# Patient Record
Sex: Male | Born: 1990 | Race: White | Hispanic: No | Marital: Single | Smoking: Never smoker
Health system: Southern US, Community
[De-identification: ages and names within clinical notes are randomized; demographics above are authoritative.]

## PROBLEM LIST (undated history)

## (undated) DIAGNOSIS — F959 Tic disorder, unspecified: Secondary | ICD-10-CM

## (undated) DIAGNOSIS — F419 Anxiety disorder, unspecified: Secondary | ICD-10-CM

## (undated) DIAGNOSIS — F909 Attention-deficit hyperactivity disorder, unspecified type: Secondary | ICD-10-CM

## (undated) HISTORY — DX: Anxiety disorder, unspecified: F41.9

## (undated) HISTORY — PX: TONSILECTOMY, ADENOIDECTOMY, BILATERAL MYRINGOTOMY AND TUBES: SHX2538

## (undated) HISTORY — DX: Tic disorder, unspecified: F95.9

## (undated) HISTORY — DX: Attention-deficit hyperactivity disorder, unspecified type: F90.9

## (undated) HISTORY — PX: TYMPANOSTOMY TUBE PLACEMENT: SHX32

---

## 2016-03-15 ENCOUNTER — Ambulatory Visit (INDEPENDENT_AMBULATORY_CARE_PROVIDER_SITE_OTHER): Payer: 59 | Admitting: Nurse Practitioner

## 2016-03-15 ENCOUNTER — Telehealth: Payer: Self-pay

## 2016-03-15 ENCOUNTER — Other Ambulatory Visit: Payer: Self-pay

## 2016-03-15 ENCOUNTER — Encounter: Payer: Self-pay | Admitting: Nurse Practitioner

## 2016-03-15 DIAGNOSIS — R935 Abnormal findings on diagnostic imaging of other abdominal regions, including retroperitoneum: Secondary | ICD-10-CM

## 2016-03-15 DIAGNOSIS — R1031 Right lower quadrant pain: Secondary | ICD-10-CM | POA: Diagnosis not present

## 2016-03-15 DIAGNOSIS — R109 Unspecified abdominal pain: Secondary | ICD-10-CM | POA: Insufficient documentation

## 2016-03-15 DIAGNOSIS — G8929 Other chronic pain: Secondary | ICD-10-CM

## 2016-03-15 MED ORDER — NA SULFATE-K SULFATE-MG SULF 17.5-3.13-1.6 GM/177ML PO SOLN: 1.0000 | ORAL | 0 refills | Status: DC

## 2016-03-15 NOTE — Telephone Encounter (Signed)
Pre-op appt 03/25/16 at 2:45pm. Called and informed pt's mother. Letter mailed with prep instructions.

## 2016-03-15 NOTE — Assessment & Plan Note (Signed)
Abdominal pain is worse in the right lower quadrant. It is still ongoing. He is due to finish his antibiotics and prednisone today. CT concerning for possible Crohn's disease as noted above. Further workup as per above, proceed with colonoscopy.

## 2016-03-15 NOTE — Telephone Encounter (Signed)
Called pt's mother. TCS with Propofol with [facility] scheduled for 03/30/16 at 12:15pm. Will mail instructions when pre-op is made.

## 2016-03-15 NOTE — Patient Instructions (Signed)
PA# for TCS: [id number]

## 2016-03-15 NOTE — Progress Notes (Signed)
cc'ed to pcp °

## 2016-03-15 NOTE — Progress Notes (Signed)
Primary Care Physician:  Donzetta SprungERRY DANIEL, MD Primary Gastroenterologist:  Dr. Darrick PennaFields  Chief Complaint  Patient presents with  . Abdominal Pain    severe over 1 wk, RLQ  . Constipation  . Diarrhea  . Nausea    comes and goes    HPI:   Jason Meza is a 25 y.o. male who presents On referral from primary care for possible Crohn's disease. Patient was last seen by primary care on 03/10/2016. PCP notes reviewed. Patient noted right lower quadrant pain, severe/acute. Worsening tenderness to palpation, no guarding or acute abdomen, no hepatosplenomegaly. CT of the abdomen and pelvis was ordered and completed at [facility]. Findings include distal ileal and proximal cecal wall thickening with mucosal irregularity in the region of the ileocecal valve, no fistula, no frank bowel obstruction. Appearance concerning for either Crohn's disease or terminal ileitis/cecal colitis. No bowel abnormality appreciated elsewhere. Normal appendix. The patient was started on Cipro and Flagyl as well as a course of prednisone. He is completed prednisone, is on his last day of antibiotics.  Today he is accompanied by his mother. Today he states his abdominal pain is still present. Saturday was better, then became worse again. Still on prednisone and antibiotics. Started as bilateral side/back pain. He went a week without a bowel movement but took a laxative and 4 days ago has a diarrhea bowel movement. Was having generalized abdominal pain and when he had a bowel movement it improved but the RLQ pain remained, described as "throbbing." Denies hematochezia, melena. Has been nauseated but no vomiting. Has had poor energy for about the past 4-5 months. Mom notes he has been really sluiggish, came home from work, eat, and then did nothing; friends asking him to go out and would say "Mom I just don't feel like doing anything." Thinks he has an on/off fever last week (wake in cold sweats, get really cold then really  hot.)   26 week premie, has had persistent health problems. Family history of lots of "bowel issues."   Past Medical History:  Diagnosis Date  . Anxiety   . Tic disorder     Past Surgical History:  Procedure Laterality Date  . TONSILECTOMY, ADENOIDECTOMY, BILATERAL MYRINGOTOMY AND TUBES    . TYMPANOSTOMY TUBE PLACEMENT      Current Outpatient Prescriptions  Medication Sig Dispense Refill  . buPROPion (WELLBUTRIN XL) 150 MG 24 hr tablet Take 150 mg by mouth daily.    . busPIRone (BUSPAR) 15 MG tablet Take 15 mg by mouth 3 (three) times daily.  0  . ciprofloxacin (CIPRO) 500 MG tablet Take 500 mg by mouth 2 (two) times daily. Will finish 03/15/16    . DULoxetine (CYMBALTA) 60 MG capsule Take 60 mg by mouth 2 (two) times daily.    . metroNIDAZOLE (FLAGYL) 500 MG tablet Take 500 mg by mouth 2 (two) times daily. Will finish 03/15/16    . traZODone (DESYREL) 50 MG tablet Take 50 mg by mouth at bedtime.     No current facility-administered medications for this visit.     Allergies as of 03/15/2016  . (No Known Allergies)    Family History  Problem Relation Age of Onset  . Colon cancer Paternal Grandfather   . Crohn's disease Neg Hx   . Ulcerative colitis Neg Hx     Social History   Social History  . Marital status: Single    Spouse name: N/A  . Number of children: N/A  . Years of education:  N/A   Occupational History  . Not on file.   Social History Main Topics  . Smoking status: Never Smoker  . Smokeless tobacco: Current User    Types: Snuff  . Alcohol use Yes     Comment: 2 weekends a month  . Drug use: No  . Sexual activity: Not on file   Other Topics Concern  . Not on file   Social History Narrative  . No narrative on file    Review of Systems: Complete ROS negative except as per HPI.    Physical Exam: BP 123/78   Pulse 89   Temp 97.8 F (36.6 C) (Oral)   Ht 5\' 8"  (1.727 m)   Wt 159 lb 12.8 oz (72.5 kg)   BMI 24.30 kg/m  General:   Alert  and oriented. Pleasant and cooperative. Well-nourished and well-developed. Tic noted. Head:  Normocephalic and atraumatic. Eyes:  Without icterus, sclera clear and conjunctiva pink.  Ears:  Normal auditory acuity. Cardiovascular:  S1, S2 present without murmurs appreciated. Extremities without clubbing or edema. Respiratory:  Clear to auscultation bilaterally. No wheezes, rales, or rhonchi. No distress.  Gastrointestinal:  +BS, soft, and non-distended. Significant pain lower abdomen and RLQ; no RLQ hernia noted. No HSM noted. No guarding or rebound. No masses appreciated.  Rectal:  Deferred  Musculoskalatal:  Symmetrical without gross deformities. Neurologic:  Alert and oriented x4;  grossly normal neurologically. Psych:  Alert and cooperative. Normal mood and affect. Heme/Lymph/Immune: No excessive bruising noted.    03/15/2016 10:04 AM   Disclaimer: This note was dictated with voice recognition software. Similar sounding words can inadvertently be transcribed and may not be corrected upon review.

## 2016-03-15 NOTE — Assessment & Plan Note (Addendum)
CT of the abdomen completed at [facility] notes ileocecal valve and cecal inflammation, concerning for Crohn's. No other abnormalities. I will check a CBC, CMP, CRP, ESR today. We'll set the patient up for colonoscopy to further evaluate. Return for follow-up in 4-6 weeks.  Proceed with colonoscopy on propofol/MAC with Dr. Oneida Alar in the near future. The risks, benefits, and alternatives have been discussed in detail with the patient. They state understanding and desire to proceed.   The patient is currently on Wellbutrin, BuSpar, Cymbalta, and trazodone. No other anticoagulants, anxiolytics, chronic pain medications, or antidepressants. We will plan for the procedure on propofol/MAC to promote adequate sedation.

## 2016-03-15 NOTE — Patient Instructions (Signed)
1. Have your blood work drawn when you're able to. 2. We will schedule your procedure for you as soon as possible. 3. Return for follow-up in 6 weeks. 4. If you have any severe or worsening symptoms call us. If we are unavailable, or if you have unbearable abdominal pain, difficulty keeping down food and fluids, rectal bleeding, or other "warning signs" proceed to the emergency room.

## 2016-03-16 LAB — COMPREHENSIVE METABOLIC PANEL
ALT: 22 U/L (ref 9–46)
AST: 15 U/L (ref 10–40)
Albumin: 4.8 g/dL (ref 3.6–5.1)
Alkaline Phosphatase: 41 U/L (ref 40–115)
BUN: 12 mg/dL (ref 7–25)
CHLORIDE: 100 mmol/L (ref 98–110)
CO2: 26 mmol/L (ref 20–31)
Calcium: 9.9 mg/dL (ref 8.6–10.3)
Creat: 1.23 mg/dL (ref 0.60–1.35)
GLUCOSE: 85 mg/dL (ref 65–99)
POTASSIUM: 4.1 mmol/L (ref 3.5–5.3)
Sodium: 138 mmol/L (ref 135–146)
Total Bilirubin: 0.7 mg/dL (ref 0.2–1.2)
Total Protein: 6.9 g/dL (ref 6.1–8.1)

## 2016-03-16 LAB — CBC WITH DIFFERENTIAL/PLATELET
BASOS PCT: 0 %
Basophils Absolute: 0 cells/uL (ref 0–200)
EOS ABS: 67 {cells}/uL (ref 15–500)
EOS PCT: 1 %
HCT: 49.9 % (ref 38.5–50.0)
Hemoglobin: 17.3 g/dL — ABNORMAL HIGH (ref 13.2–17.1)
LYMPHS PCT: 23 %
Lymphs Abs: 1541 cells/uL (ref 850–3900)
MCH: 32.9 pg (ref 27.0–33.0)
MCHC: 34.7 g/dL (ref 32.0–36.0)
MCV: 94.9 fL (ref 80.0–100.0)
MONOS PCT: 6 %
MPV: 10.5 fL (ref 7.5–12.5)
Monocytes Absolute: 402 cells/uL (ref 200–950)
Neutro Abs: 4690 cells/uL (ref 1500–7800)
Neutrophils Relative %: 70 %
PLATELETS: 290 10*3/uL (ref 140–400)
RBC: 5.26 MIL/uL (ref 4.20–5.80)
RDW: 13.6 % (ref 11.0–15.0)
WBC: 6.7 10*3/uL (ref 3.8–10.8)

## 2016-03-16 LAB — C-REACTIVE PROTEIN: CRP: <0.2 mg/L (ref <8.0)

## 2016-03-17 LAB — SEDIMENTATION RATE: Sed Rate: 1 mm/hr (ref 0–15)

## 2016-03-17 NOTE — Progress Notes (Signed)
PT is aware.

## 2016-03-17 NOTE — Progress Notes (Signed)
LMOM to call.

## 2016-03-20 NOTE — Progress Notes (Signed)
REVIEWED. TCS WITH ULTRASLIM COLONOSCOPE.

## 2016-03-23 NOTE — Patient Instructions (Signed)
Jason Meza  03/23/2016     @PREFPERIOPPHARMACY @   Your procedure is scheduled on  03/30/2016   [facility] to [facility] at  1015  A.M.  Call this number if you have problems the morning of surgery:  [phone]   Remember:  Do not eat food or drink liquids after midnight.  Take these medicines the morning of surgery with A SIP OF WATER  Wellbutrin, buspar, cymbalta.   Do not wear jewelry, make-up or nail polish.  Do not wear lotions, powders, or perfumes, or deoderant.  Do not shave 48 hours prior to surgery.  Men may shave face and neck.  Do not bring valuables to the hospital.  [facility] is not responsible for any belongings or valuables.  Contacts, dentures or bridgework may not be worn into surgery.  Leave your suitcase in the car.  After surgery it may be brought to your room.  For patients admitted to the hospital, discharge time will be determined by your treatment team.  Patients discharged the day of surgery will not be allowed to drive home.   Name and phone number of your driver:   family Special instructions:  Follow the diet and prep instructions given to you by Dr Evelina DunField's office.  Please read over the following fact sheets that you were given. Anesthesia Post-op Instructions and Care and Recovery After Surgery       Colonoscopy, Adult A colonoscopy is an exam to look at the large intestine. It is done to check for problems, such as:  Lumps (tumors).  Growths (polyps).  Swelling (inflammation).  Bleeding. What happens before the procedure? Eating and drinking Follow instructions from your doctor about eating and drinking. These instructions may include:  A few days before the procedure - follow a low-fiber diet.  Avoid nuts.  Avoid seeds.  Avoid dried fruit.  Avoid raw fruits.  Avoid vegetables.  1-3 days before the procedure - follow a clear liquid diet. Avoid liquids that have red or purple dye. Drink only clear  liquids, such as:  Clear broth or bouillon.  Black coffee or tea.  Clear juice.  Clear soft drinks or sports drinks.  [place] desert.  Popsicles.  On the day of the procedure - do not eat or drink anything during the 2 hours before the procedure. Bowel prep If you were prescribed an oral bowel prep:  Take it as told by your doctor. Starting the day before your procedure, you will need to drink a lot of liquid. The liquid will cause you to poop (have bowel movements) until your poop is almost clear or light green.  If your skin or butt gets irritated from diarrhea, you may:  Wipe the area with wipes that have medicine in them, such as adult wet wipes with aloe and vitamin E.  Put something on your skin that soothes the area, such as petroleum jelly.  If you throw up (vomit) while drinking the bowel prep, take a break for up to 60 minutes. Then begin the bowel prep again. If you keep throwing up and you cannot take the bowel prep without throwing up, call your doctor. General instructions  Ask your doctor about changing or stopping your normal medicines. This is important if you take diabetes medicines or blood thinners.  Plan to have someone take you home from the hospital or clinic. What happens during the procedure?  An IV tube may be put into one  of your veins.  You will be given medicine to help you relax (sedative).  To reduce your risk of infection:  Your doctors will wash their hands.  Your anal area will be washed with soap.  You will be asked to lie on your side with your knees bent.  Your doctor will get a long, thin, flexible tube ready. The tube will have a camera and a light on the end.  The tube will be put into your anus.  The tube will be gently put into your large intestine.  Air will be delivered into your large intestine to keep it open. You may feel some pressure or cramping.  The camera will be used to take photos.  A small tissue sample may  be removed from your body to be looked at under a microscope (biopsy). If any possible problems are found, the tissue will be sent to a lab for testing.  If small growths are found, your doctor may remove them and have them checked for cancer.  The tube that was put into your anus will be slowly removed. The procedure may vary among doctors and hospitals. What happens after the procedure?  Your doctor will check on you often until the medicines you were given have worn off.  Do not drive for 24 hours after the procedure.  You may have a small amount of blood in your poop.  You may pass gas.  You may have mild cramps or bloating in your belly (abdomen).  It is up to you to get the results of your procedure. Ask your doctor, or the department performing the procedure, when your results will be ready. This information is not intended to replace advice given to you by your health care provider. Make sure you discuss any questions you have with your health care provider. Document Released: 04/17/2010 Document Revised: 11/20/2015 Document Reviewed: 05/27/2015 Elsevier Interactive Patient Education  2017 [employer]  Colonoscopy, Adult, Care After This sheet gives you information about how to care for yourself after your procedure. Your doctor may also give you more specific instructions. If you have problems or questions, call your doctor. Follow these instructions at home: General instructions  For the first 24 hours after the procedure:  Do not drive or use machinery.  Do not sign important documents.  Do not drink alcohol.  Do your daily activities more slowly than normal.  Eat foods that are soft and easy to digest.  Rest often.  Take over-the-counter or prescription medicines only as told by your doctor.  It is up to you to get the results of your procedure. Ask your doctor, or the department performing the procedure, when your results will be ready. To help cramping  and bloating:  Try walking around.  Put heat on your belly (abdomen) as told by your doctor. Use a heat source that your doctor recommends, such as a moist heat pack or a heating pad.  Put a towel between your skin and the heat source.  Leave the heat on for 20-30 minutes.  Remove the heat if your skin turns bright red. This is especially important if you cannot feel pain, heat, or cold. You can get burned. Eating and drinking  Drink enough fluid to keep your pee (urine) clear or pale yellow.  Return to your normal diet as told by your doctor. Avoid heavy or fried foods that are hard to digest.  Avoid drinking alcohol for as long as told by your doctor. Contact  a doctor if:  You have blood in your poop (stool) 2-3 days after the procedure. Get help right away if:  You have more than a small amount of blood in your poop.  You see large clumps of tissue (blood clots) in your poop.  Your belly is swollen.  You feel sick to your stomach (nauseous).  You throw up (vomit).  You have a fever.  You have belly pain that gets worse, and medicine does not help your pain. This information is not intended to replace advice given to you by your health care provider. Make sure you discuss any questions you have with your health care provider. Document Released: 04/17/2010 Document Revised: 12/08/2015 Document Reviewed: 12/08/2015 Elsevier Interactive Patient Education  2017 [employer] Inc[employer]  Monitored Anesthesia Care Anesthesia is a term that refers to techniques, procedures, and medicines that help a person stay safe and comfortable during a medical procedure. Monitored anesthesia care, or sedation, is one type of anesthesia. Your anesthesia specialist may recommend sedation if you will be having a procedure that does not require you to be unconscious, such as:  Cataract surgery.  A dental procedure.  A biopsy.  A colonoscopy. During the procedure, you may receive a medicine to help  you relax (sedative). There are three levels of sedation:  Mild sedation. At this level, you may feel awake and relaxed. You will be able to follow directions.  Moderate sedation. At this level, you will be sleepy. You may not remember the procedure.  Deep sedation. At this level, you will be asleep. You will not remember the procedure. The more medicine you are given, the deeper your level of sedation will be. Depending on how you respond to the procedure, the anesthesia specialist may change your level of sedation or the type of anesthesia to fit your needs. An anesthesia specialist will monitor you closely during the procedure. Let your health care provider know about:  Any allergies you have.  All medicines you are taking, including vitamins, herbs, eye drops, creams, and over-the-counter medicines.  Any use of steroids (by mouth or as a cream).  Any problems you or family members have had with sedatives and anesthetic medicines.  Any blood disorders you have.  Any surgeries you have had.  Any medical conditions you have, such as sleep apnea.  Whether you are pregnant or may be pregnant.  Any use of cigarettes, alcohol, or street drugs. What are the risks? Generally, this is a safe procedure. However, problems may occur, including:  Getting too much medicine (oversedation).  Nausea.  Allergic reaction to medicines.  Trouble breathing. If this happens, a breathing tube may be used to help with breathing. It will be removed when you are awake and breathing on your own.  Heart trouble.  Lung trouble. Before the procedure Staying hydrated  Follow instructions from your health care provider about hydration, which may include:  Up to 2 hours before the procedure - you may continue to drink clear liquids, such as water, clear fruit juice, black coffee, and plain tea. Eating and drinking restrictions  Follow instructions from your health care provider about eating and  drinking, which may include:  8 hours before the procedure - stop eating heavy meals or foods such as meat, fried foods, or fatty foods.  6 hours before the procedure - stop eating light meals or foods, such as toast or cereal.  6 hours before the procedure - stop drinking milk or drinks that contain milk.  2  hours before the procedure - stop drinking clear liquids. Medicines  Ask your health care provider about:  Changing or stopping your regular medicines. This is especially important if you are taking diabetes medicines or blood thinners.  Taking medicines such as aspirin and ibuprofen. These medicines can thin your blood. Do not take these medicines before your procedure if your health care provider instructs you not to. Tests and exams  You will have a physical exam.  You may have blood tests done to show:  How well your kidneys and liver are working.  How well your blood can clot.  General instructions  Plan to have someone take you home from the hospital or clinic.  If you will be going home right after the procedure, plan to have someone with you for 24 hours. What happens during the procedure?  Your blood pressure, heart rate, breathing, level of pain and overall condition will be monitored.  An IV tube will be inserted into one of your veins.  Your anesthesia specialist will give you medicines as needed to keep you comfortable during the procedure. This may mean changing the level of sedation.  The procedure will be performed. After the procedure  Your blood pressure, heart rate, breathing rate, and blood oxygen level will be monitored until the medicines you were given have worn off.  Do not drive for 24 hours if you received a sedative.  You may:  Feel sleepy, clumsy, or nauseous.  Feel forgetful about what happened after the procedure.  Have a sore throat if you had a breathing tube during the procedure.  Vomit. This information is not intended to  replace advice given to you by your health care provider. Make sure you discuss any questions you have with your health care provider. Document Released: 12/09/2004 Document Revised: 08/22/2015 Document Reviewed: 07/06/2015 Elsevier Interactive Patient Education  2017 [employer] Inc[employer] Monitored Anesthesia Care, Care After These instructions provide you with information about caring for yourself after your procedure. Your health care provider may also give you more specific instructions. Your treatment has been planned according to current medical practices, but problems sometimes occur. Call your health care provider if you have any problems or questions after your procedure. What can I expect after the procedure? After your procedure, it is common to:  Feel sleepy for several hours.  Feel clumsy and have poor balance for several hours.  Feel forgetful about what happened after the procedure.  Have poor judgment for several hours.  Feel nauseous or vomit.  Have a sore throat if you had a breathing tube during the procedure. Follow these instructions at home: For at least 24 hours after the procedure:   Do not:  Participate in activities in which you could fall or become injured.  Drive.  Use heavy machinery.  Drink alcohol.  Take sleeping pills or medicines that cause drowsiness.  Make important decisions or sign legal documents.  Take care of children on your own.  Rest. Eating and drinking  Follow the diet that is recommended by your health care provider.  If you vomit, drink water, juice, or soup when you can drink without vomiting.  Make sure you have little or no nausea before eating solid foods. General instructions  Have a responsible adult stay with you until you are awake and alert.  Take over-the-counter and prescription medicines only as told by your health care provider.  If you smoke, do not smoke without supervision.  Keep all follow-up visits as told  by your health care provider. This is important. Contact a health care provider if:  You keep feeling nauseous or you keep vomiting.  You feel light-headed.  You develop a rash.  You have a fever. Get help right away if:  You have trouble breathing. This information is not intended to replace advice given to you by your health care provider. Make sure you discuss any questions you have with your health care provider. Document Released: 07/06/2015 Document Revised: 11/05/2015 Document Reviewed: 07/06/2015 Elsevier Interactive Patient Education  2017 [employer].

## 2016-03-25 ENCOUNTER — Encounter (HOSPITAL_COMMUNITY)
Admission: RE | Admit: 2016-03-25 | Discharge: 2016-03-25 | Disposition: A | Discharge status: Home / Self Care | Payer: 59 | Source: Ambulatory Visit | Attending: Gastroenterology | Admitting: Gastroenterology

## 2016-03-25 ENCOUNTER — Encounter: Payer: Self-pay | Admitting: Gastroenterology

## 2016-03-25 ENCOUNTER — Encounter (HOSPITAL_COMMUNITY): Payer: Self-pay

## 2016-03-30 ENCOUNTER — Encounter (HOSPITAL_COMMUNITY): Payer: Self-pay

## 2016-03-30 ENCOUNTER — Ambulatory Visit (HOSPITAL_COMMUNITY): Payer: 59 | Admitting: Anesthesiology

## 2016-03-30 ENCOUNTER — Encounter (HOSPITAL_COMMUNITY)
Admission: RE | Disposition: A | Discharge status: Home / Self Care | Payer: Self-pay | Source: Ambulatory Visit | Attending: Gastroenterology

## 2016-03-30 ENCOUNTER — Ambulatory Visit (HOSPITAL_COMMUNITY)
Admission: RE | Admit: 2016-03-30 | Discharge: 2016-03-30 | Disposition: A | Discharge status: Home / Self Care | Payer: 59 | Source: Ambulatory Visit | Attending: Gastroenterology | Admitting: Gastroenterology

## 2016-03-30 DIAGNOSIS — F419 Anxiety disorder, unspecified: Secondary | ICD-10-CM | POA: Insufficient documentation

## 2016-03-30 DIAGNOSIS — G8929 Other chronic pain: Secondary | ICD-10-CM

## 2016-03-30 DIAGNOSIS — Q438 Other specified congenital malformations of intestine: Secondary | ICD-10-CM | POA: Insufficient documentation

## 2016-03-30 DIAGNOSIS — Z800 Family history of malignant neoplasm of digestive organs: Secondary | ICD-10-CM | POA: Diagnosis not present

## 2016-03-30 DIAGNOSIS — R109 Unspecified abdominal pain: Secondary | ICD-10-CM | POA: Diagnosis not present

## 2016-03-30 DIAGNOSIS — Z9889 Other specified postprocedural states: Secondary | ICD-10-CM | POA: Diagnosis not present

## 2016-03-30 DIAGNOSIS — R935 Abnormal findings on diagnostic imaging of other abdominal regions, including retroperitoneum: Secondary | ICD-10-CM

## 2016-03-30 DIAGNOSIS — F959 Tic disorder, unspecified: Secondary | ICD-10-CM | POA: Diagnosis not present

## 2016-03-30 DIAGNOSIS — K529 Noninfective gastroenteritis and colitis, unspecified: Secondary | ICD-10-CM | POA: Diagnosis not present

## 2016-03-30 DIAGNOSIS — R933 Abnormal findings on diagnostic imaging of other parts of digestive tract: Secondary | ICD-10-CM | POA: Diagnosis not present

## 2016-03-30 DIAGNOSIS — Z79899 Other long term (current) drug therapy: Secondary | ICD-10-CM | POA: Diagnosis not present

## 2016-03-30 DIAGNOSIS — R1031 Right lower quadrant pain: Secondary | ICD-10-CM

## 2016-03-30 DIAGNOSIS — K6389 Other specified diseases of intestine: Secondary | ICD-10-CM | POA: Insufficient documentation

## 2016-03-30 HISTORY — PX: BIOPSY: SHX5522

## 2016-03-30 HISTORY — PX: COLONOSCOPY WITH PROPOFOL: SHX5780

## 2016-03-30 SURGERY — COLONOSCOPY WITH PROPOFOL
Anesthesia: Monitor Anesthesia Care

## 2016-03-30 MED ORDER — MIDAZOLAM HCL 2 MG/2ML IJ SOLN
INTRAMUSCULAR | Status: AC
  Filled 2016-03-30: qty 2

## 2016-03-30 MED ORDER — PROPOFOL 10 MG/ML IV BOLUS
INTRAVENOUS | Status: AC
  Filled 2016-03-30: qty 20

## 2016-03-30 MED ORDER — MIDAZOLAM HCL 5 MG/5ML IJ SOLN
INTRAMUSCULAR | Status: DC | PRN
  Administered 2016-03-30 (×2): 2 mg via INTRAVENOUS

## 2016-03-30 MED ORDER — CHLORHEXIDINE GLUCONATE CLOTH 2 % EX PADS: 6.0000 | MEDICATED_PAD | Freq: Once | CUTANEOUS | Status: DC

## 2016-03-30 MED ORDER — FENTANYL CITRATE (PF) 100 MCG/2ML IJ SOLN
25.0000 ug | INTRAMUSCULAR | Status: AC | PRN
  Administered 2016-03-30 (×2): 25 ug via INTRAVENOUS

## 2016-03-30 MED ORDER — PROPOFOL 500 MG/50ML IV EMUL
INTRAVENOUS | Status: DC | PRN
  Administered 2016-03-30: 125 ug/kg/min via INTRAVENOUS

## 2016-03-30 MED ORDER — LACTATED RINGERS IV SOLN
INTRAVENOUS | Status: DC
  Administered 2016-03-30: 1000 mL via INTRAVENOUS

## 2016-03-30 MED ORDER — FENTANYL CITRATE (PF) 100 MCG/2ML IJ SOLN
INTRAMUSCULAR | Status: AC
  Filled 2016-03-30: qty 2

## 2016-03-30 MED ORDER — MIDAZOLAM HCL 2 MG/2ML IJ SOLN
1.0000 mg | INTRAMUSCULAR | Status: DC | PRN
  Administered 2016-03-30: 2 mg via INTRAVENOUS

## 2016-03-30 NOTE — Discharge Instructions (Signed)
YOU DID NOT HAVE ANY POLYPS. Your small bowel was mildly inflamed but YOUR COLON IS NORMAL.   DRINK WATER TO KEEP YOUR URINE LIGHT YELLOW.  FOLLOW A HIGH FIBER DIET.  AVOID ITEMS THAT CAUSE BLOATING & GAS. SEE INFO BELOW.  I WILL SCHEDULE YOUR FOLLOW UP VISIT AFTER THE FINAL PATHOLOGY REPORT COMES BACK TO ME.   Colonoscopy Care After Read the instructions outlined below and refer to this sheet in the next week. These discharge instructions provide you with general information on caring for yourself after you leave the hospital. While your treatment has been planned according to the most current medical practices available, unavoidable complications occasionally occur. If you have any problems or questions after discharge, call DR. Breigh Annett, [phone].  ACTIVITY  You may resume your regular activity, but move at a slower pace for the next 24 hours.   Take frequent rest periods for the next 24 hours.   Walking will help get rid of the air and reduce the bloated feeling in your belly (abdomen).   No driving for 24 hours (because of the medicine (anesthesia) used during the test).   You may shower.   Do not sign any important legal documents or operate any machinery for 24 hours (because of the anesthesia used during the test).    NUTRITION  Drink plenty of fluids.   You may resume your normal diet as instructed by your doctor.   Begin with a light meal and progress to your normal diet. Heavy or fried foods are harder to digest and may make you feel sick to your stomach (nauseated).   Avoid alcoholic beverages for 24 hours or as instructed.    MEDICATIONS  You may resume your normal medications.   WHAT YOU CAN EXPECT TODAY  Some feelings of bloating in the abdomen.   Passage of more gas than usual.   Spotting of blood in your stool or on the toilet paper  .  IF YOU HAD POLYPS REMOVED DURING THE COLONOSCOPY:  Eat a soft diet IF YOU HAVE NAUSEA, BLOATING, ABDOMINAL  PAIN, OR VOMITING.    FINDING OUT THE RESULTS OF YOUR TEST Not all test results are available during your visit. DR. Darrick PennaFIELDS WILL CALL YOU WITHIN 14 DAYS OF YOUR PROCEDUE WITH YOUR RESULTS. Do not assume everything is normal if you have not heard from DR. Saphyra Hutt, CALL HER OFFICE AT [phone].  SEEK IMMEDIATE MEDICAL ATTENTION AND CALL THE OFFICE: [phone] IF:  You have more than a spotting of blood in your stool.   Your belly is swollen (abdominal distention).   You are nauseated or vomiting.   You have a temperature over 101F.   You have abdominal pain or discomfort that is severe or gets worse throughout the day.   High-Fiber Diet A high-fiber diet changes your normal diet to include more whole grains, legumes, fruits, and vegetables. Changes in the diet involve replacing refined carbohydrates with unrefined foods. The calorie level of the diet is essentially unchanged. The Dietary Reference Intake (recommended amount) for adult males is 38 grams per day. For adult females, it is 25 grams per day. Pregnant and lactating women should consume 28 grams of fiber per day. Fiber is the intact part of a plant that is not broken down during digestion. Functional fiber is fiber that has been isolated from the plant to provide a beneficial effect in the body.  PURPOSE  Increase stool bulk.   Ease and regulate bowel movements.   Lower  cholesterol.   REDUCE RISK OF COLON CANCER  INDICATIONS THAT YOU NEED MORE FIBER  Constipation and hemorrhoids.   Uncomplicated diverticulosis (intestine condition) and irritable bowel syndrome.   Weight management.   As a protective measure against hardening of the arteries (atherosclerosis), diabetes, and cancer.   GUIDELINES FOR INCREASING FIBER IN THE DIET  Start adding fiber to the diet slowly. A gradual increase of about 5 more grams (2 slices of whole-wheat bread, 2 servings of most fruits or vegetables, or 1 bowl of high-fiber cereal)  per day is best. Too rapid an increase in fiber may result in constipation, flatulence, and bloating.   Drink enough water and fluids to keep your urine clear or pale yellow. Water, juice, or caffeine-free drinks are recommended. Not drinking enough fluid may cause constipation.   Eat a variety of high-fiber foods rather than one type of fiber.   Try to increase your intake of fiber through using high-fiber foods rather than fiber pills or supplements that contain small amounts of fiber.   The goal is to change the types of food eaten. Do not supplement your present diet with high-fiber foods, but replace foods in your present diet.   INCLUDE A VARIETY OF FIBER SOURCES  Replace refined and processed grains with whole grains, canned fruits with fresh fruits, and incorporate other fiber sources. White rice, white breads, and most bakery goods contain little or no fiber.   Brown whole-grain rice, buckwheat oats, and many fruits and vegetables are all good sources of fiber. These include: broccoli, Brussels sprouts, cabbage, cauliflower, beets, sweet potatoes, white potatoes (skin on), carrots, tomatoes, eggplant, squash, berries, fresh fruits, and dried fruits.   Cereals appear to be the richest source of fiber. Cereal fiber is found in whole grains and bran. Bran is the fiber-rich outer coat of cereal grain, which is largely removed in refining. In whole-grain cereals, the bran remains. In breakfast cereals, the largest amount of fiber is found in those with "bran" in their names. The fiber content is sometimes indicated on the label.      You may need to include additional fruits and vegetables each day.   In baking, for 1 cup white flour, you may use the following substitutions:   1 cup whole-wheat flour minus 2 tablespoons.   1/2 cup white flour plus 1/2 cup whole-wheat flour

## 2016-03-30 NOTE — H&P (Signed)
  Primary Care Physician:  Gar Ponto, MD Primary Gastroenterologist:  Dr. Oneida Alar  Pre-Procedure History & Physical: HPI:  Jason Meza is a 26 y.o. male here for Abnormal CT scan-THICKENED CECAL WALL/ILEUM.  Past Medical History:  Diagnosis Date  . Anxiety   . Tic disorder     Past Surgical History:  Procedure Laterality Date  . TONSILECTOMY, ADENOIDECTOMY, BILATERAL MYRINGOTOMY AND TUBES    . TYMPANOSTOMY TUBE PLACEMENT      Prior to Admission medications   Medication Sig Start Date End Date Taking? Authorizing Provider  buPROPion (WELLBUTRIN XL) 150 MG 24 hr tablet Take 150 mg by mouth daily.   Yes Historical Provider, MD  busPIRone (BUSPAR) 15 MG tablet Take 15 mg by mouth 3 (three) times daily. 01/05/16  Yes Historical Provider, MD  DULoxetine (CYMBALTA) 60 MG capsule Take 60 mg by mouth 2 (two) times daily.   Yes Historical Provider, MD  Na Sulfate-K Sulfate-Mg Sulf (SUPREP BOWEL PREP KIT) 17.5-3.13-1.6 GM/180ML SOLN Take 1 kit by mouth as directed. 03/15/16  Yes Danie Binder, MD  traZODone (DESYREL) 50 MG tablet Take 50 mg by mouth at bedtime.   Yes Historical Provider, MD    Allergies as of 03/15/2016  . (No Known Allergies)    Family History  Problem Relation Age of Onset  . Colon cancer Paternal Grandfather   . Crohn's disease Neg Hx   . Ulcerative colitis Neg Hx     Social History   Social History  . Marital status: Single    Spouse name: N/A  . Number of children: N/A  . Years of education: N/A   Occupational History  . Not on file.   Social History Main Topics  . Smoking status: Never Smoker  . Smokeless tobacco: Current User    Types: Snuff  . Alcohol use Yes     Comment: 2 weekends a month  . Drug use: No  . Sexual activity: Not on file   Other Topics Concern  . Not on file   Social History Narrative  . No narrative on file    Review of Systems: See HPI, otherwise negative ROS   Physical Exam: BP 114/76   Pulse 81   Temp 97.8  F (36.6 C) (Oral)   Resp (!) 26   Ht _0  (1.727 m)   Wt 159 lb (72.1 kg)   SpO2 97%   BMI 24.18 kg/m  General:   Alert,  pleasant and cooperative in NAD Head:  Normocephalic and atraumatic. Neck:  Supple; Lungs:  Clear throughout to auscultation.    Heart:  Regular rate and rhythm. Abdomen:  Soft, nontender and nondistended. Normal bowel sounds, without guarding, and without rebound.   Neurologic:  Alert and  oriented x4;  grossly normal neurologically.  Impression/Plan:     Abnormal CT scan-THICKENED CECAL WALL/ILEUM  Plan:  TCS TODAY. DISCUSSED PROCEDURE, BENEFITS, & RISKS: < 1% chance of medication reaction, bleeding, perforation, or rupture of spleen/liver.

## 2016-03-30 NOTE — Anesthesia Preprocedure Evaluation (Signed)
Anesthesia Evaluation  Patient identified by MRN, date of birth, ID band Patient awake    Reviewed: Allergy & Precautions, NPO status , Patient's Chart, lab work & pertinent test results  Airway Mallampati: II  TM Distance: >3 FB     Dental  (+) Teeth Intact   Pulmonary neg pulmonary ROS,    breath sounds clear to auscultation       Cardiovascular negative cardio ROS   Rhythm:Regular Rate:Normal     Neuro/Psych PSYCHIATRIC DISORDERS Anxiety    GI/Hepatic neg GERD  ,  Endo/Other    Renal/GU      Musculoskeletal   Abdominal   Peds  Hematology   Anesthesia Other Findings   Reproductive/Obstetrics                             Anesthesia Physical Anesthesia Plan  ASA: II  Anesthesia Plan: MAC   Post-op Pain Management:    Induction: Intravenous  Airway Management Planned: Simple Face Mask  Additional Equipment:   Intra-op Plan:   Post-operative Plan:   Informed Consent: I have reviewed the patients History and Physical, chart, labs and discussed the procedure including the risks, benefits and alternatives for the proposed anesthesia with the patient or authorized representative who has indicated his/her understanding and acceptance.     Plan Discussed with:   Anesthesia Plan Comments:         Anesthesia Quick Evaluation

## 2016-03-30 NOTE — Op Note (Signed)
[facility] Patient Name: Jason Meza Procedure Date: 03/30/2016 10:37 AM MRN: [id number] Date of Birth: [birth year] Attending MD: Jonette Eva , MD CSN: [id number] Age: 26 Admit Type: Outpatient Procedure:                Colonoscopy With COLD FORCEPS BIOPSY Indications:              Abnormal CT of the GI tract-FAMILY HISTORY OF COLON                            CANCER Providers:                Jonette Eva, MD, Nena Polio, RN, Lollie Marrow. Lake,                            Pensions consultant Referring MD:             Lucita Lora. Daniel Medicines:                Propofol per Anesthesia Complications:            No immediate complications. Estimated Blood Loss:     Estimated blood loss was minimal. Procedure:                Pre-Anesthesia Assessment:                           - Prior to the procedure, a History and Physical                            was performed, and patient medications and                            allergies were reviewed. The patient's tolerance of                            previous anesthesia was also reviewed. The risks                            and benefits of the procedure and the sedation                            options and risks were discussed with the patient.                            All questions were answered, and informed consent                            was obtained. Prior Anticoagulants: The patient has                            taken no previous anticoagulant or antiplatelet                            agents. ASA Grade Assessment: II - A patient with  mild systemic disease. After reviewing the risks                            and benefits, the patient was deemed in                            satisfactory condition to undergo the procedure.                            After obtaining informed consent, the colonoscope                            was passed under direct vision. Throughout the                            procedure,  the patient's blood pressure, pulse, and                            oxygen saturations were monitored continuously. The                            EC-[id number] ([id number]) scope was introduced through                            the anus and advanced to the 10 cm into the ileum.                            The terminal ileum, ileocecal valve, appendiceal                            orifice, and rectum were photographed. The                            colonoscopy was performed without difficulty. The                            patient tolerated the procedure well. The quality                            of the bowel preparation was excellent. Scope In: 11:03:51 AM Scope Out: 11:24:14 AM Scope Withdrawal Time: 0 hours 16 minutes 14 seconds  Total Procedure Duration: 0 hours 20 minutes 23 seconds  Findings:      Patchy inflammation, mild in severity and characterized by congestion       (edema) and erythema was found in the terminal ileum. This was biopsied       with a cold forceps for histology(#1).      The rectum, recto-sigmoid colon and sigmoid colon were mildly redundant.       Multiple biopsies were obtained in the rectum(#4), in the sigmoid       colon/in the descending colon,(#3) at the splenic flexure, in the       transverse colon/in the ascending colon/in the cecum(#2) with cold       forceps for histology.      No additional abnormalities were found  on retroflexion. Impression:               - MILD Ileitis.                           - Redundant LEFT colon. Moderate Sedation:      Per Anesthesia Care Recommendation:           - Resume previous diet.                           - Continue present medications.                           - Await pathology results.                           - Return to my office at appointment to be                            scheduled AFTER PATH REPORT FINAL.                           - Patient has a contact number available for                             emergencies. The signs and symptoms of potential                            delayed complications were discussed with the                            patient. Return to normal activities tomorrow.                            Written discharge instructions were provided to the                            patient.                           - No repeat colonoscopy due to age. Procedure Code(s):        --- Professional ---                           [phone], Colonoscopy, flexible; with biopsy, single                            or multiple Diagnosis Code(s):        --- Professional ---                           K52.9, Noninfective gastroenteritis and colitis,                            unspecified  R93.3, Abnormal findings on diagnostic imaging of                            other parts of digestive tract                           Q43.8, Other specified congenital malformations of                            intestine CPT copyright 2016 [employer]. All rights reserved. The codes documented in this report are preliminary and upon coder review may  be revised to meet current compliance requirements. Jonette Eva, MD Jonette Eva, MD 03/30/2016 11:49:16 AM This report has been signed electronically. Number of Addenda: 0

## 2016-03-30 NOTE — Anesthesia Postprocedure Evaluation (Signed)
Anesthesia Post Note  Patient: Jason Meza  Procedure(s) Performed: Procedure(s) (LRB): COLONOSCOPY WITH PROPOFOL (N/A) BIOPSY  Patient location during evaluation: PACU Anesthesia Type: MAC Level of consciousness: patient cooperative and awake Pain management: pain level controlled Vital Signs Assessment: post-procedure vital signs reviewed and stable Respiratory status: spontaneous breathing, nonlabored ventilation and respiratory function stable Cardiovascular status: blood pressure returned to baseline Postop Assessment: no signs of nausea or vomiting Anesthetic complications: no     Last Vitals:  Vitals:   03/30/16 1040 03/30/16 1136  BP: 114/74   Pulse:  86  Resp: 15 13  Temp:  36.4 C    Last Pain:  Vitals:   03/30/16 1136  TempSrc:   PainSc: Asleep                 Leahann Lempke J

## 2016-03-30 NOTE — Transfer of Care (Signed)
Immediate Anesthesia Transfer of Care Note  Patient: Jason Meza  Procedure(s) Performed: Procedure(s) with comments: COLONOSCOPY WITH PROPOFOL (N/A) - 12:15pm BIOPSY - ileum, right and leeft colon; rectum  Patient Location: PACU  Anesthesia Type:MAC  Level of Consciousness: patient cooperative  Airway & Oxygen Therapy: Patient Spontanous Breathing and Patient connected to nasal cannula oxygen  Post-op Assessment: Report given to RN and Post -op Vital signs reviewed and stable  Post vital signs: Reviewed and stable  Last Vitals:  Vitals:   03/30/16 1035 03/30/16 1040  BP: 106/68 114/74  Pulse:    Resp: (!) 0 15  Temp:      Last Pain:  Vitals:   03/30/16 0852  TempSrc: Oral      Patients Stated Pain Goal: 4 (16/57/90 3833)  Complications: No apparent anesthesia complications

## 2016-03-31 ENCOUNTER — Telehealth: Payer: Self-pay | Admitting: Gastroenterology

## 2016-03-31 NOTE — Telephone Encounter (Signed)
PLEASE CALL PT. HIS BIOPSIES SHOW MILD INFLAMMATION IN THE SMALL BOWEL. HE HAS NORMAL COLON AND RECTUM BIOPSIES.   DRINK WATER. EAT FIBER. PLEASE CALL WITH QUESTIONS OR CONCERNS.

## 2016-04-01 ENCOUNTER — Encounter (HOSPITAL_COMMUNITY): Payer: Self-pay | Admitting: Gastroenterology

## 2016-04-01 NOTE — Telephone Encounter (Signed)
Pt is aware of results. 

## 2016-04-19 ENCOUNTER — Ambulatory Visit: Payer: 59 | Admitting: Nurse Practitioner

## 2016-05-05 ENCOUNTER — Ambulatory Visit: Payer: 59 | Admitting: Nurse Practitioner

## 2016-05-17 DIAGNOSIS — Z0001 Encounter for general adult medical examination with abnormal findings: Secondary | ICD-10-CM | POA: Diagnosis not present

## 2016-05-17 DIAGNOSIS — Z6823 Body mass index (BMI) 23.0-23.9, adult: Secondary | ICD-10-CM | POA: Diagnosis not present

## 2016-05-17 DIAGNOSIS — Z23 Encounter for immunization: Secondary | ICD-10-CM | POA: Diagnosis not present

## 2016-06-01 DIAGNOSIS — J019 Acute sinusitis, unspecified: Secondary | ICD-10-CM | POA: Diagnosis not present

## 2016-06-02 ENCOUNTER — Ambulatory Visit: Payer: 59 | Admitting: Nurse Practitioner

## 2016-06-02 NOTE — Progress Notes (Deleted)
Referring Provider: Caryl Bis, MD Primary Care Physician:  Gar Ponto, MD Primary GI:  Dr. Oneida Alar  No chief complaint on file.   HPI:   Jason Meza is a 26 y.o. male who presents for follow-up on abdominal pain and abnormal CT the abdomen. The patient was last seen in our office 03/15/2016. He has a history of significant "bowel issues" and was born at 91 weeks. CT of the abdomen and pelvis was ordered and completed at [facility]. Findings include distal ileal and proximal cecal wall thickening with mucosal irregularity in the region of the ileocecal valve, no fistula, no frank bowel obstruction. Appearance concerning for either Crohn's disease or terminal ileitis/cecal colitis. No bowel abnormality appreciated elsewhere. Normal appendix.  At the time of his last visit he was still having abdominal pain which is waxing and waning in intensity. Improvement with bowel movement in general, right lower quadrant pain remained described as "throbbing." Noted poor energy for the previous 4-6 months. Labs are ordered including CBC, CMP, CRP, ESR. Additionally, the patient was arranged for colonoscopy on propofol/MAC due to polypharmacy. CBC was essentially normal, CMP normal, CRP and ESR normal.  Colonoscopy completed 03/30/2016 which found patchy inflammation, mild in severity and characterized by edema and erythema in the terminal ileum status post biopsy. Noted redundancy of the rectum, rectosigmoid colon, sigmoid colon with multiple biopsies obtained in the rectum, sigmoid colon/descending colon, splenic flexure, transverse colon, ascending colon. Overall impression mild ileitis. Surgical pathology as per below. Overall biopsy showed mild inflammation in the small bowel, normal colon and rectum. Recommended drink water, eat fiber, call with questions or concerns.  Today he states     Past Medical History:  Diagnosis Date  . Anxiety   . Tic disorder     Past Surgical  History:  Procedure Laterality Date  . BIOPSY  03/30/2016   Procedure: BIOPSY;  Surgeon: Danie Binder, MD;  Location: [facility];  Service: Endoscopy;;  ileum, right and leeft colon; rectum  . COLONOSCOPY WITH PROPOFOL N/A 03/30/2016   Procedure: COLONOSCOPY WITH PROPOFOL;  Surgeon: Danie Binder, MD;  Location: [facility];  Service: Endoscopy;  Laterality: N/A;  12:15pm  . TONSILECTOMY, ADENOIDECTOMY, BILATERAL MYRINGOTOMY AND TUBES    . TYMPANOSTOMY TUBE PLACEMENT      Current Outpatient Prescriptions  Medication Sig Dispense Refill  . buPROPion (WELLBUTRIN XL) 150 MG 24 hr tablet Take 150 mg by mouth daily.    . busPIRone (BUSPAR) 15 MG tablet Take 15 mg by mouth 3 (three) times daily.  0  . DULoxetine (CYMBALTA) 60 MG capsule Take 60 mg by mouth 2 (two) times daily.    . traZODone (DESYREL) 50 MG tablet Take 50 mg by mouth at bedtime.     No current facility-administered medications for this visit.     Allergies as of 06/02/2016  . (No Known Allergies)    Family History  Problem Relation Age of Onset  . Colon cancer Paternal Grandfather   . Crohn's disease Neg Hx   . Ulcerative colitis Neg Hx     Social History   Social History  . Marital status: Single    Spouse name: N/A  . Number of children: N/A  . Years of education: N/A   Social History Main Topics  . Smoking status: Never Smoker  . Smokeless tobacco: Current User    Types: Snuff  . Alcohol use Yes     Comment: 2 weekends a month  .  Drug use: No  . Sexual activity: Not on file   Other Topics Concern  . Not on file   Social History Narrative  . No narrative on file    Review of Systems: General: Negative for anorexia, weight loss, fever, chills, fatigue, weakness. Eyes: Negative for vision changes.  ENT: Negative for hoarseness, difficulty swallowing , nasal congestion. CV: Negative for chest pain, angina, palpitations, dyspnea on exertion, peripheral edema.  Respiratory: Negative for dyspnea  at rest, dyspnea on exertion, cough, sputum, wheezing.  GI: See history of present illness. GU:  Negative for dysuria, hematuria, urinary incontinence, urinary frequency, nocturnal urination.  MS: Negative for joint pain, low back pain.  Derm: Negative for rash or itching.  Neuro: Negative for weakness, abnormal sensation, seizure, frequent headaches, memory loss, confusion.  Psych: Negative for anxiety, depression, suicidal ideation, hallucinations.  Endo: Negative for unusual weight change.  Heme: Negative for bruising or bleeding. Allergy: Negative for rash or hives.   Physical Exam: There were no vitals taken for this visit. General:   Alert and oriented. Pleasant and cooperative. Well-nourished and well-developed.  Head:  Normocephalic and atraumatic. Eyes:  Without icterus, sclera clear and conjunctiva pink.  Ears:  Normal auditory acuity. Mouth:  No deformity or lesions, oral mucosa pink.  Throat/Neck:  Supple, without mass or thyromegaly. Cardiovascular:  S1, S2 present without murmurs appreciated. Normal pulses noted. Extremities without clubbing or edema. Respiratory:  Clear to auscultation bilaterally. No wheezes, rales, or rhonchi. No distress.  Gastrointestinal:  +BS, soft, non-tender and non-distended. No HSM noted. No guarding or rebound. No masses appreciated.  Rectal:  Deferred  Musculoskalatal:  Symmetrical without gross deformities. Normal posture. Skin:  Intact without significant lesions or rashes. Neurologic:  Alert and oriented x4;  grossly normal neurologically. Psych:  Alert and cooperative. Normal mood and affect. Heme/Lymph/Immune: No significant cervical adenopathy. No excessive bruising noted.    06/02/2016 1:56 PM   Disclaimer: This note was dictated with voice recognition software. Similar sounding words can inadvertently be transcribed and may not be corrected upon review.

## 2016-08-02 DIAGNOSIS — J019 Acute sinusitis, unspecified: Secondary | ICD-10-CM | POA: Diagnosis not present

## 2016-08-02 DIAGNOSIS — Z6823 Body mass index (BMI) 23.0-23.9, adult: Secondary | ICD-10-CM | POA: Diagnosis not present

## 2016-08-03 DIAGNOSIS — S134XXA Sprain of ligaments of cervical spine, initial encounter: Secondary | ICD-10-CM | POA: Diagnosis not present

## 2016-08-03 DIAGNOSIS — S335XXA Sprain of ligaments of lumbar spine, initial encounter: Secondary | ICD-10-CM | POA: Diagnosis not present

## 2016-08-03 DIAGNOSIS — S233XXA Sprain of ligaments of thoracic spine, initial encounter: Secondary | ICD-10-CM | POA: Diagnosis not present

## 2016-09-07 DIAGNOSIS — J019 Acute sinusitis, unspecified: Secondary | ICD-10-CM | POA: Diagnosis not present

## 2016-09-07 DIAGNOSIS — J209 Acute bronchitis, unspecified: Secondary | ICD-10-CM | POA: Diagnosis not present

## 2016-09-07 DIAGNOSIS — Z6823 Body mass index (BMI) 23.0-23.9, adult: Secondary | ICD-10-CM | POA: Diagnosis not present

## 2016-09-22 DIAGNOSIS — S233XXA Sprain of ligaments of thoracic spine, initial encounter: Secondary | ICD-10-CM | POA: Diagnosis not present

## 2016-09-22 DIAGNOSIS — S134XXA Sprain of ligaments of cervical spine, initial encounter: Secondary | ICD-10-CM | POA: Diagnosis not present

## 2016-09-22 DIAGNOSIS — S335XXA Sprain of ligaments of lumbar spine, initial encounter: Secondary | ICD-10-CM | POA: Diagnosis not present

## 2016-10-26 DIAGNOSIS — S134XXA Sprain of ligaments of cervical spine, initial encounter: Secondary | ICD-10-CM | POA: Diagnosis not present

## 2016-10-26 DIAGNOSIS — S335XXA Sprain of ligaments of lumbar spine, initial encounter: Secondary | ICD-10-CM | POA: Diagnosis not present

## 2016-10-26 DIAGNOSIS — K5901 Slow transit constipation: Secondary | ICD-10-CM | POA: Diagnosis not present

## 2016-10-26 DIAGNOSIS — S233XXA Sprain of ligaments of thoracic spine, initial encounter: Secondary | ICD-10-CM | POA: Diagnosis not present

## 2016-10-26 DIAGNOSIS — R300 Dysuria: Secondary | ICD-10-CM | POA: Diagnosis not present

## 2016-12-08 DIAGNOSIS — Z6823 Body mass index (BMI) 23.0-23.9, adult: Secondary | ICD-10-CM | POA: Diagnosis not present

## 2016-12-08 DIAGNOSIS — F411 Generalized anxiety disorder: Secondary | ICD-10-CM | POA: Diagnosis not present

## 2016-12-08 DIAGNOSIS — K5901 Slow transit constipation: Secondary | ICD-10-CM | POA: Diagnosis not present

## 2016-12-13 DIAGNOSIS — M546 Pain in thoracic spine: Secondary | ICD-10-CM | POA: Diagnosis not present

## 2016-12-13 DIAGNOSIS — S338XXA Sprain of other parts of lumbar spine and pelvis, initial encounter: Secondary | ICD-10-CM | POA: Diagnosis not present

## 2016-12-13 DIAGNOSIS — S134XXA Sprain of ligaments of cervical spine, initial encounter: Secondary | ICD-10-CM | POA: Diagnosis not present

## 2017-01-07 DIAGNOSIS — Z23 Encounter for immunization: Secondary | ICD-10-CM | POA: Diagnosis not present

## 2017-01-24 DIAGNOSIS — S134XXA Sprain of ligaments of cervical spine, initial encounter: Secondary | ICD-10-CM | POA: Diagnosis not present

## 2017-01-24 DIAGNOSIS — M546 Pain in thoracic spine: Secondary | ICD-10-CM | POA: Diagnosis not present

## 2017-01-24 DIAGNOSIS — S338XXA Sprain of other parts of lumbar spine and pelvis, initial encounter: Secondary | ICD-10-CM | POA: Diagnosis not present

## 2017-02-22 DIAGNOSIS — H65199 Other acute nonsuppurative otitis media, unspecified ear: Secondary | ICD-10-CM | POA: Diagnosis not present

## 2017-02-22 DIAGNOSIS — H60339 Swimmer's ear, unspecified ear: Secondary | ICD-10-CM | POA: Diagnosis not present

## 2017-03-17 DIAGNOSIS — M546 Pain in thoracic spine: Secondary | ICD-10-CM | POA: Diagnosis not present

## 2017-03-17 DIAGNOSIS — S134XXA Sprain of ligaments of cervical spine, initial encounter: Secondary | ICD-10-CM | POA: Diagnosis not present

## 2017-03-17 DIAGNOSIS — S338XXA Sprain of other parts of lumbar spine and pelvis, initial encounter: Secondary | ICD-10-CM | POA: Diagnosis not present

## 2017-03-24 DIAGNOSIS — S134XXA Sprain of ligaments of cervical spine, initial encounter: Secondary | ICD-10-CM | POA: Diagnosis not present

## 2017-03-24 DIAGNOSIS — S338XXA Sprain of other parts of lumbar spine and pelvis, initial encounter: Secondary | ICD-10-CM | POA: Diagnosis not present

## 2017-03-24 DIAGNOSIS — M546 Pain in thoracic spine: Secondary | ICD-10-CM | POA: Diagnosis not present

## 2017-04-22 DIAGNOSIS — M546 Pain in thoracic spine: Secondary | ICD-10-CM | POA: Diagnosis not present

## 2017-04-22 DIAGNOSIS — S338XXA Sprain of other parts of lumbar spine and pelvis, initial encounter: Secondary | ICD-10-CM | POA: Diagnosis not present

## 2017-04-22 DIAGNOSIS — S134XXA Sprain of ligaments of cervical spine, initial encounter: Secondary | ICD-10-CM | POA: Diagnosis not present

## 2017-06-10 DIAGNOSIS — M791 Myalgia, unspecified site: Secondary | ICD-10-CM | POA: Diagnosis not present

## 2017-06-10 DIAGNOSIS — S46912A Strain of unspecified muscle, fascia and tendon at shoulder and upper arm level, left arm, initial encounter: Secondary | ICD-10-CM | POA: Diagnosis not present

## 2017-07-21 DIAGNOSIS — M546 Pain in thoracic spine: Secondary | ICD-10-CM | POA: Diagnosis not present

## 2017-07-21 DIAGNOSIS — S338XXA Sprain of other parts of lumbar spine and pelvis, initial encounter: Secondary | ICD-10-CM | POA: Diagnosis not present

## 2017-07-21 DIAGNOSIS — S134XXA Sprain of ligaments of cervical spine, initial encounter: Secondary | ICD-10-CM | POA: Diagnosis not present

## 2017-07-29 DIAGNOSIS — H6692 Otitis media, unspecified, left ear: Secondary | ICD-10-CM | POA: Diagnosis not present

## 2017-07-29 DIAGNOSIS — J029 Acute pharyngitis, unspecified: Secondary | ICD-10-CM | POA: Diagnosis not present

## 2017-08-30 DIAGNOSIS — M19111 Post-traumatic osteoarthritis, right shoulder: Secondary | ICD-10-CM | POA: Diagnosis not present

## 2017-08-30 DIAGNOSIS — F411 Generalized anxiety disorder: Secondary | ICD-10-CM | POA: Diagnosis not present

## 2017-08-30 DIAGNOSIS — F331 Major depressive disorder, recurrent, moderate: Secondary | ICD-10-CM | POA: Diagnosis not present

## 2017-08-30 DIAGNOSIS — Z1389 Encounter for screening for other disorder: Secondary | ICD-10-CM | POA: Diagnosis not present

## 2017-08-30 DIAGNOSIS — K219 Gastro-esophageal reflux disease without esophagitis: Secondary | ICD-10-CM | POA: Diagnosis not present

## 2017-08-30 DIAGNOSIS — Z1331 Encounter for screening for depression: Secondary | ICD-10-CM | POA: Diagnosis not present

## 2017-09-13 DIAGNOSIS — S338XXA Sprain of other parts of lumbar spine and pelvis, initial encounter: Secondary | ICD-10-CM | POA: Diagnosis not present

## 2017-09-13 DIAGNOSIS — M546 Pain in thoracic spine: Secondary | ICD-10-CM | POA: Diagnosis not present

## 2017-09-13 DIAGNOSIS — S134XXA Sprain of ligaments of cervical spine, initial encounter: Secondary | ICD-10-CM | POA: Diagnosis not present

## 2017-11-17 DIAGNOSIS — M546 Pain in thoracic spine: Secondary | ICD-10-CM | POA: Diagnosis not present

## 2017-11-17 DIAGNOSIS — S335XXA Sprain of ligaments of lumbar spine, initial encounter: Secondary | ICD-10-CM | POA: Diagnosis not present

## 2017-11-17 DIAGNOSIS — S134XXA Sprain of ligaments of cervical spine, initial encounter: Secondary | ICD-10-CM | POA: Diagnosis not present

## 2018-01-04 DIAGNOSIS — F331 Major depressive disorder, recurrent, moderate: Secondary | ICD-10-CM | POA: Diagnosis not present

## 2018-01-04 DIAGNOSIS — K219 Gastro-esophageal reflux disease without esophagitis: Secondary | ICD-10-CM | POA: Diagnosis not present

## 2018-01-04 DIAGNOSIS — Z23 Encounter for immunization: Secondary | ICD-10-CM | POA: Diagnosis not present

## 2018-01-04 DIAGNOSIS — F900 Attention-deficit hyperactivity disorder, predominantly inattentive type: Secondary | ICD-10-CM | POA: Diagnosis not present

## 2018-01-04 DIAGNOSIS — F411 Generalized anxiety disorder: Secondary | ICD-10-CM | POA: Diagnosis not present

## 2018-01-18 DIAGNOSIS — Z6825 Body mass index (BMI) 25.0-25.9, adult: Secondary | ICD-10-CM | POA: Diagnosis not present

## 2018-01-18 DIAGNOSIS — N3281 Overactive bladder: Secondary | ICD-10-CM | POA: Diagnosis not present

## 2018-01-18 DIAGNOSIS — K5901 Slow transit constipation: Secondary | ICD-10-CM | POA: Diagnosis not present

## 2018-01-19 DIAGNOSIS — L700 Acne vulgaris: Secondary | ICD-10-CM | POA: Diagnosis not present

## 2018-01-19 DIAGNOSIS — L732 Hidradenitis suppurativa: Secondary | ICD-10-CM | POA: Diagnosis not present

## 2018-02-01 DIAGNOSIS — K219 Gastro-esophageal reflux disease without esophagitis: Secondary | ICD-10-CM | POA: Diagnosis not present

## 2018-02-01 DIAGNOSIS — F411 Generalized anxiety disorder: Secondary | ICD-10-CM | POA: Diagnosis not present

## 2018-02-01 DIAGNOSIS — F331 Major depressive disorder, recurrent, moderate: Secondary | ICD-10-CM | POA: Diagnosis not present

## 2018-02-01 DIAGNOSIS — F900 Attention-deficit hyperactivity disorder, predominantly inattentive type: Secondary | ICD-10-CM | POA: Diagnosis not present

## 2018-02-07 ENCOUNTER — Ambulatory Visit: Payer: BLUE CROSS/BLUE SHIELD | Admitting: Urology

## 2018-02-07 DIAGNOSIS — R300 Dysuria: Secondary | ICD-10-CM

## 2018-03-01 DIAGNOSIS — M546 Pain in thoracic spine: Secondary | ICD-10-CM | POA: Diagnosis not present

## 2018-03-01 DIAGNOSIS — S134XXA Sprain of ligaments of cervical spine, initial encounter: Secondary | ICD-10-CM | POA: Diagnosis not present

## 2018-03-01 DIAGNOSIS — S338XXA Sprain of other parts of lumbar spine and pelvis, initial encounter: Secondary | ICD-10-CM | POA: Diagnosis not present

## 2018-03-20 DIAGNOSIS — S134XXA Sprain of ligaments of cervical spine, initial encounter: Secondary | ICD-10-CM | POA: Diagnosis not present

## 2018-03-20 DIAGNOSIS — S338XXA Sprain of other parts of lumbar spine and pelvis, initial encounter: Secondary | ICD-10-CM | POA: Diagnosis not present

## 2018-03-20 DIAGNOSIS — M546 Pain in thoracic spine: Secondary | ICD-10-CM | POA: Diagnosis not present

## 2018-05-01 DIAGNOSIS — S338XXA Sprain of other parts of lumbar spine and pelvis, initial encounter: Secondary | ICD-10-CM | POA: Diagnosis not present

## 2018-05-01 DIAGNOSIS — M546 Pain in thoracic spine: Secondary | ICD-10-CM | POA: Diagnosis not present

## 2018-05-01 DIAGNOSIS — S134XXA Sprain of ligaments of cervical spine, initial encounter: Secondary | ICD-10-CM | POA: Diagnosis not present

## 2018-05-17 DIAGNOSIS — F900 Attention-deficit hyperactivity disorder, predominantly inattentive type: Secondary | ICD-10-CM | POA: Diagnosis not present

## 2018-05-17 DIAGNOSIS — K219 Gastro-esophageal reflux disease without esophagitis: Secondary | ICD-10-CM | POA: Diagnosis not present

## 2018-05-17 DIAGNOSIS — F331 Major depressive disorder, recurrent, moderate: Secondary | ICD-10-CM | POA: Diagnosis not present

## 2018-05-17 DIAGNOSIS — F411 Generalized anxiety disorder: Secondary | ICD-10-CM | POA: Diagnosis not present

## 2018-05-22 DIAGNOSIS — S134XXA Sprain of ligaments of cervical spine, initial encounter: Secondary | ICD-10-CM | POA: Diagnosis not present

## 2018-05-22 DIAGNOSIS — S338XXA Sprain of other parts of lumbar spine and pelvis, initial encounter: Secondary | ICD-10-CM | POA: Diagnosis not present

## 2018-05-22 DIAGNOSIS — M546 Pain in thoracic spine: Secondary | ICD-10-CM | POA: Diagnosis not present

## 2018-06-02 DIAGNOSIS — K4090 Unilateral inguinal hernia, without obstruction or gangrene, not specified as recurrent: Secondary | ICD-10-CM | POA: Diagnosis not present

## 2018-06-02 DIAGNOSIS — Z6825 Body mass index (BMI) 25.0-25.9, adult: Secondary | ICD-10-CM | POA: Diagnosis not present

## 2018-06-05 DIAGNOSIS — R1031 Right lower quadrant pain: Secondary | ICD-10-CM | POA: Diagnosis not present

## 2018-06-05 DIAGNOSIS — Z6825 Body mass index (BMI) 25.0-25.9, adult: Secondary | ICD-10-CM | POA: Diagnosis not present

## 2018-07-25 DIAGNOSIS — K5901 Slow transit constipation: Secondary | ICD-10-CM | POA: Diagnosis not present

## 2018-07-25 DIAGNOSIS — Z6825 Body mass index (BMI) 25.0-25.9, adult: Secondary | ICD-10-CM | POA: Diagnosis not present

## 2018-08-09 DIAGNOSIS — K219 Gastro-esophageal reflux disease without esophagitis: Secondary | ICD-10-CM | POA: Diagnosis not present

## 2018-12-14 DIAGNOSIS — S43401A Unspecified sprain of right shoulder joint, initial encounter: Secondary | ICD-10-CM | POA: Diagnosis not present

## 2018-12-14 DIAGNOSIS — S335XXA Sprain of ligaments of lumbar spine, initial encounter: Secondary | ICD-10-CM | POA: Diagnosis not present

## 2018-12-14 DIAGNOSIS — S134XXA Sprain of ligaments of cervical spine, initial encounter: Secondary | ICD-10-CM | POA: Diagnosis not present

## 2018-12-14 DIAGNOSIS — S233XXA Sprain of ligaments of thoracic spine, initial encounter: Secondary | ICD-10-CM | POA: Diagnosis not present

## 2018-12-21 DIAGNOSIS — S134XXA Sprain of ligaments of cervical spine, initial encounter: Secondary | ICD-10-CM | POA: Diagnosis not present

## 2018-12-21 DIAGNOSIS — S43401A Unspecified sprain of right shoulder joint, initial encounter: Secondary | ICD-10-CM | POA: Diagnosis not present

## 2018-12-21 DIAGNOSIS — S233XXA Sprain of ligaments of thoracic spine, initial encounter: Secondary | ICD-10-CM | POA: Diagnosis not present

## 2018-12-21 DIAGNOSIS — S335XXA Sprain of ligaments of lumbar spine, initial encounter: Secondary | ICD-10-CM | POA: Diagnosis not present

## 2019-01-30 DIAGNOSIS — S43401A Unspecified sprain of right shoulder joint, initial encounter: Secondary | ICD-10-CM | POA: Diagnosis not present

## 2019-01-30 DIAGNOSIS — S335XXA Sprain of ligaments of lumbar spine, initial encounter: Secondary | ICD-10-CM | POA: Diagnosis not present

## 2019-01-30 DIAGNOSIS — S233XXA Sprain of ligaments of thoracic spine, initial encounter: Secondary | ICD-10-CM | POA: Diagnosis not present

## 2019-01-30 DIAGNOSIS — S134XXA Sprain of ligaments of cervical spine, initial encounter: Secondary | ICD-10-CM | POA: Diagnosis not present

## 2019-03-19 DIAGNOSIS — S233XXA Sprain of ligaments of thoracic spine, initial encounter: Secondary | ICD-10-CM | POA: Diagnosis not present

## 2019-03-19 DIAGNOSIS — S43401A Unspecified sprain of right shoulder joint, initial encounter: Secondary | ICD-10-CM | POA: Diagnosis not present

## 2019-03-19 DIAGNOSIS — S335XXA Sprain of ligaments of lumbar spine, initial encounter: Secondary | ICD-10-CM | POA: Diagnosis not present

## 2019-03-19 DIAGNOSIS — S134XXA Sprain of ligaments of cervical spine, initial encounter: Secondary | ICD-10-CM | POA: Diagnosis not present

## 2021-04-17 ENCOUNTER — Ambulatory Visit: Payer: BC Managed Care – PPO | Admitting: Neurology

## 2021-04-17 ENCOUNTER — Encounter: Payer: Self-pay | Admitting: Neurology

## 2021-04-17 VITALS — BP 124/79 | HR 99 | Ht 69.0 in | Wt 202.0 lb

## 2021-04-17 DIAGNOSIS — R531 Weakness: Secondary | ICD-10-CM | POA: Diagnosis not present

## 2021-04-17 DIAGNOSIS — G4733 Obstructive sleep apnea (adult) (pediatric): Secondary | ICD-10-CM | POA: Diagnosis not present

## 2021-04-17 DIAGNOSIS — R202 Paresthesia of skin: Secondary | ICD-10-CM | POA: Diagnosis not present

## 2021-04-17 NOTE — Progress Notes (Signed)
Chief Complaint  Patient presents with   New Patient (Initial Visit)    Pt in room 2 alone pt states he has numbness and tingling in both arms, hands and fingers . Pt states he has problems gripping and turning items .       ASSESSMENT AND PLAN  Jason Meza is a 31 y.o. male   Bilateral fourth and fifth finger paresthesia, subjective weak grip,  In the setting of Tourette's syndrome, depression anxiety, chronic insomnia  Essentially normal neurological examination,  He does complains of neck pain, desire further evaluation,  Proceed with MRI of cervical spine to rule out bilateral cervical radiculopathy  EMG nerve conduction study for possible ulnar neuropathy  Laboratory evaluation including TSH At risk for obstructive sleep apnea  Rapid weight gain over the past few years, loud snoring, draped soft palate, narrow oropharyngeal space, excessive daytime fatigue and sleepiness,  DIAGNOSTIC DATA (LABS, IMAGING, TESTING) - I reviewed patient records, labs, notes, testing and imaging myself where available.   MEDICAL HISTORY:  Jason Meza, is a 31 year old male, is seen in request by primary care physician Dr. Gar Ponto for evaluation of bilateral hands numbness tingling, weakness, initial evaluation was on April 17, 2021   I reviewed and summarized the referring note.PMHX. ADHD Anxiety Chronic insomnia Chronic migraine. Tourette.  He had long history of Tourette's, presented with motor tics, frequent eye blinking, vocal tics, cough, clearing of the throat, over the years, he has been treated, stated he can better control of his tics, but there was frequent coughing, throat function during interview  He also complains of stressful few years, with rapid weight gain of 40 pounds over the past few years, also had suboptimal control of his chronic insomnia, anxiety, changed to Wellbutrin 150 mg daily, also taking Adderall for ADHD,  Since July 2022, he complains of  bilateral fourth and fifth fingers numbness, intermittent initially did not become persistent, also describes intermittent subjective weakness, to the point of difficulty opening the door, opening a jar,  numbness is persistent, but weakness are intermittent, sometimes he can do, the other times he has more difficult,  He works as a Designer, multimedia, denies difficulty handling his job, does complains of neck pain, denies significant elbow pain, denies gait abnormality, no bowel bladder incontinence  He also complains of frequent migraine headaches, intermittent radiating pain behind the eyeball  PHYSICAL EXAM:   Vitals:   04/17/21 0840  BP: 124/79  Pulse: 99  Weight: 202 lb (91.6 kg)  Height: 5\' 9"  (1.753 m)     Body mass index is 29.83 kg/m.  PHYSICAL EXAMNIATION:  Gen: NAD, conversant, well nourised, well groomed                     Cardiovascular: Regular rate rhythm, no peripheral edema, warm, nontender. Eyes: Conjunctivae clear without exudates or hemorrhage Neck: Supple, no carotid bruits. Pulmonary: Clear to auscultation bilaterally   NEUROLOGICAL EXAM:  MENTAL STATUS: Speech:    Speech is normal; fluent and spontaneous with normal comprehension.  Cognition:     Orientation to time, place and person     Normal recent and remote memory     Normal Attention span and concentration     Normal Language, naming, repeating,spontaneous speech     Fund of knowledge   CRANIAL NERVES: CN II: Visual fields are full to confrontation. Pupils are round equal and briskly reactive to light. CN III, IV, VI: extraocular movement are normal. No ptosis.  CN V: Facial sensation is intact to light touch CN VII: Face is symmetric with normal eye closure  CN VIII: Hearing is normal to causal conversation. CN IX, X: Phonation is normal. CN XI: Head turning and shoulder shrug are intact  MOTOR: There is no pronator drift of out-stretched arms. Muscle bulk and tone are normal. Muscle strength is  normal.  REFLEXES: Reflexes are 2+ and symmetric at the biceps, triceps, knees, and ankles. Plantar responses are flexor.  SENSORY: Intact to light touch, pinprick and vibratory sensation are intact in fingers and toes.  COORDINATION: There is no trunk or limb dysmetria noted.  GAIT/STANCE: Posture is normal. Gait is steady with normal steps, base, arm swing, and turning. Heel and toe walking are normal. Tandem gait is normal.  Romberg is absent.  REVIEW OF SYSTEMS:  Full 14 system review of systems performed and notable only for as above All other review of systems were negative.   ALLERGIES: Allergies  Allergen Reactions   Chloral Hydrate     HOME MEDICATIONS: Current Outpatient Medications  Medication Sig Dispense Refill   amphetamine-dextroamphetamine (ADDERALL XR) 10 MG 24 hr capsule Take 10 mg by mouth daily.     buPROPion (WELLBUTRIN XL) 150 MG 24 hr tablet Take 150 mg by mouth daily.     DULoxetine (CYMBALTA) 60 MG capsule Take 60 mg by mouth 2 (two) times daily.     famotidine (PEPCID) 40 MG tablet Take 40 mg by mouth 2 (two) times daily.     meclizine (ANTIVERT) 12.5 MG tablet Take 12.5 mg by mouth 3 (three) times daily as needed for dizziness.     OXYBUTYNIN CHLORIDE PO Take 5 mg by mouth.     pantoprazole (PROTONIX) 40 MG tablet Take 40 mg by mouth daily.     SUMAtriptan (IMITREX) 100 MG tablet Take 100 mg by mouth every 2 (two) hours as needed for migraine. May repeat in 2 hours if headache persists or recurs.     traZODone (DESYREL) 50 MG tablet Take 50 mg by mouth at bedtime.     busPIRone (BUSPAR) 15 MG tablet Take 15 mg by mouth 3 (three) times daily.  0   No current facility-administered medications for this visit.    PAST MEDICAL HISTORY: Past Medical History:  Diagnosis Date   ADHD    Anxiety    Tic disorder     PAST SURGICAL HISTORY: Past Surgical History:  Procedure Laterality Date   BIOPSY  03/30/2016   Procedure: BIOPSY;  Surgeon: Danie Binder, MD;  [facility];  Service: Endoscopy;;  ileum, right and leeft colon; rectum   COLONOSCOPY WITH PROPOFOL N/A 03/30/2016   Procedure: COLONOSCOPY WITH PROPOFOL;  Surgeon: Danie Binder, MD;  [facility]: [facility];  Service: Endoscopy;  Laterality: N/A;  12:15pm   TONSILECTOMY, ADENOIDECTOMY, BILATERAL MYRINGOTOMY AND TUBES     TYMPANOSTOMY TUBE PLACEMENT      FAMILY HISTORY: Family History  Problem Relation Age of Onset   Depression Mother    Arthritis Father    Colon cancer Paternal Grandfather    Crohn's disease Neg Hx    Ulcerative colitis Neg Hx    Neuropathy Neg Hx     SOCIAL HISTORY: Social History   Socioeconomic History   Marital status: Single    Spouse name: Not on file   Number of children: Not on file   Years of education: Not on file   Highest education level: Not on file  Occupational History   Not on file  Tobacco Use   Smoking status: Never   Smokeless tobacco: Former    Types: Snuff  Vaping Use   Vaping Use: Never used  Substance and Sexual Activity   Alcohol use: Yes    Alcohol/week: 6.0 standard drinks    Types: 6 Cans of beer per week   Drug use: No   Sexual activity: Not on file  Other Topics Concern   Not on file  Social History Narrative   Not on file   Social Determinants of Health   Financial Resource Strain: Not on file  Food Insecurity: Not on file  Transportation Needs: Not on file  Physical Activity: Not on file  Stress: Not on file  Social Connections: Not on file  Intimate Partner Violence: Not on file      Marcial Pacas, M.D. Ph.D.  [facility] [address] [place], [place] [address] Ph: [phone] Fax: [phone]  CC:  Caryl Bis, MD [place],  [place] [id number]  Caryl Bis, MD

## 2021-04-18 LAB — CBC WITH DIFFERENTIAL/PLATELET
Basophils Absolute: 0 10*3/uL (ref 0.0–0.2)
Basos: 1 %
EOS (ABSOLUTE): 0.2 10*3/uL (ref 0.0–0.4)
Eos: 3 %
Hematocrit: 46.4 % (ref 37.5–51.0)
Hemoglobin: 16.4 g/dL (ref 13.0–17.7)
Immature Grans (Abs): 0 10*3/uL (ref 0.0–0.1)
Immature Granulocytes: 0 %
Lymphocytes Absolute: 1.6 10*3/uL (ref 0.7–3.1)
Lymphs: 28 %
MCH: 34 pg — ABNORMAL HIGH (ref 26.6–33.0)
MCHC: 35.3 g/dL (ref 31.5–35.7)
MCV: 96 fL (ref 79–97)
Monocytes Absolute: 0.4 10*3/uL (ref 0.1–0.9)
Monocytes: 8 %
Neutrophils Absolute: 3.4 10*3/uL (ref 1.4–7.0)
Neutrophils: 60 %
Platelets: 307 10*3/uL (ref 150–450)
RBC: 4.83 x10E6/uL (ref 4.14–5.80)
RDW: 13.1 % (ref 11.6–15.4)
WBC: 5.6 10*3/uL (ref 3.4–10.8)

## 2021-04-18 LAB — COMPREHENSIVE METABOLIC PANEL
ALT: 25 IU/L (ref 0–44)
AST: 28 IU/L (ref 0–40)
Albumin/Globulin Ratio: 2.4 — ABNORMAL HIGH (ref 1.2–2.2)
Albumin: 4.7 g/dL (ref 4.1–5.2)
Alkaline Phosphatase: 100 IU/L (ref 44–121)
BUN/Creatinine Ratio: 6 — ABNORMAL LOW (ref 9–20)
BUN: 8 mg/dL (ref 6–20)
Bilirubin Total: 0.4 mg/dL (ref 0.0–1.2)
CO2: 27 mmol/L (ref 20–29)
Calcium: 10.1 mg/dL (ref 8.7–10.2)
Chloride: 98 mmol/L (ref 96–106)
Creatinine, Ser: 1.29 mg/dL — ABNORMAL HIGH (ref 0.76–1.27)
Globulin, Total: 2 g/dL (ref 1.5–4.5)
Glucose: 79 mg/dL (ref 70–99)
Potassium: 4.5 mmol/L (ref 3.5–5.2)
Sodium: 140 mmol/L (ref 134–144)
Total Protein: 6.7 g/dL (ref 6.0–8.5)
eGFR: 77 mL/min/{1.73_m2} (ref >59)

## 2021-04-18 LAB — TSH: TSH: 2.12 u[IU]/mL (ref 0.450–4.500)

## 2021-04-18 LAB — CK: Total CK: 124 U/L (ref 49–439)

## 2021-04-20 ENCOUNTER — Telehealth: Payer: Self-pay | Admitting: Neurology

## 2021-04-20 NOTE — Telephone Encounter (Signed)
LVM for pt to call back to schedule  [employer] auth: [id number] (exp. 04/17/21 to 05/16/21)

## 2021-06-08 ENCOUNTER — Encounter (INDEPENDENT_AMBULATORY_CARE_PROVIDER_SITE_OTHER): Payer: BC Managed Care – PPO | Admitting: Neurology

## 2021-06-08 ENCOUNTER — Ambulatory Visit (INDEPENDENT_AMBULATORY_CARE_PROVIDER_SITE_OTHER): Payer: BC Managed Care – PPO | Admitting: Neurology

## 2021-06-08 ENCOUNTER — Other Ambulatory Visit: Payer: Self-pay

## 2021-06-08 DIAGNOSIS — R531 Weakness: Secondary | ICD-10-CM

## 2021-06-08 DIAGNOSIS — R202 Paresthesia of skin: Secondary | ICD-10-CM

## 2021-06-08 DIAGNOSIS — Z0289 Encounter for other administrative examinations: Secondary | ICD-10-CM

## 2021-06-08 NOTE — Procedures (Signed)
? ? ? ?   ?Full Name: Jason Meza Gender: Male ?MRN #: [id number] Date of Birth: 02/10/1991 ?   ?Visit Date: 06/08/2021 14:08 ?Age: 31 Years ?Examining Physician: Levert Feinstein, MD  ?Referring Physician: Levert Feinstein, MD ?Height: 5 feet 9 inch ?Patient History: 31 year old male, complains of bilateral hands intermittent paresthesia ? ?Summary of the test: ?Nerve conduction study: ?Bilateral median sensory responses showed moderately prolonged peak latency with mildly decreased snap amplitude.  Bilateral ulnar sensory and motor responses were within normal limit. ? ?Bilateral median motor responses showed mildly prolonged distal latency with normal CMAP amplitude. ? ?Electromyography: ?Selected needle examination of bilateral upper extremity muscles was performed, the only abnormality is mildly decreased recruitment pattern at bilateral abductor pollicis brevis, there is no evidence of active denervation. ?   ?Conclusion: ?This is an abnormal study.  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    ? ? ?Levert Feinstein M.D. PhD ? ?[facility] ?[address], [address] ?[place], [place] [address] ?Tel: [phone] ?Fax: [phone] ? ?Verbal informed consent was obtained from the patient, patient was informed of potential risk of procedure, including bruising, bleeding, hematoma formation, infection, muscle weakness, muscle pain, numbness, among others. ?   ? ?   ?MNC ?   ?Nerve / Sites Muscle Latency Ref. Amplitude Ref. Rel Amp Segments Distance Velocity Ref. Area  ?  ms ms mV mV %  cm m/s m/s mVms  ?L Median - APB  ?   Wrist APB 5.1 ?4.4 5.6 ?4.0 100 Wrist - APB 7   26.1  ?   Upper arm APB 8.9  5.8  102 Upper arm - Wrist 22 58 ?49 25.1  ?R Median - APB  ?   Wrist APB 4.9 ?4.4 5.8 ?4.0 100 Wrist - APB 7   25.9  ?   Upper arm APB 8.9  6.3  108  Upper arm - Wrist 20 50 ?49 28.0  ?L Ulnar - ADM  ?   Wrist ADM 3.3 ?3.3 10.5 ?6.0 100 Wrist - ADM 7   52.1  ?   B.Elbow ADM 7.4  7.5  71.5 B.Elbow - Wrist 20 49 ?49 39.0  ?   A.Elbow ADM 9.4  6.9  91.2 A.Elbow - B.Elbow 10 49 ?49 33.8  ?R Ulnar - ADM  ?   Wrist ADM 3.1 ?3.3 11.7 ?6.0 100 Wrist - ADM 7   66.1  ?   B.Elbow ADM 7.1  10.6  90.7 B.Elbow - Wrist 20 49 ?49 60.7  ?   A.Elbow ADM 11.0  8.0  75.6 A.Elbow - B.Elbow 10 26 ?49 55.8  ?           ?SNC ?   ?Nerve / Sites Rec. Site Peak Lat Ref.  Amp Ref. Segments Distance  ?  ms ms ?V ?V  cm  ?L Median - Orthodromic (Dig II, Mid palm)  ?   Dig II Wrist 4.3 ?3.4 9 ?10 Dig II - Wrist 13  ?R Median - Orthodromic (Dig II, Mid palm)  ?   Dig II Wrist 4.5 ?3.4 9 ?10 Dig II - Wrist 13  ?L Ulnar - Orthodromic, (Dig V, Mid palm)  ?   Dig V Wrist 3.0 ?3.1 5 ?5 Dig V - Wrist 11  ?R Ulnar - Orthodromic, (Dig V, Mid palm)  ?   Dig V  Wrist 3.0 ?3.1 5 ?5 Dig V - Wrist 11  ?           ?F  Wave ?   ?Nerve F Lat Ref.  ? ms ms  ?L Ulnar - ADM 30.5 ?32.0  ?R Ulnar - ADM 34.9 ?32.0  ?       ?EMG Summary Table   ? Spontaneous MUAP Recruitment  ?Muscle IA Fib PSW Fasc Other Amp Dur. Poly Pattern  ?R. Abductor pollicis brevis Normal None None None _______ Normal Normal Normal Reduced  ?R. Pronator teres Normal None None None _______ Normal Normal Normal Normal  ?R. Biceps brachii Normal None None None _______ Normal Normal Normal Normal  ?R. Deltoid Normal None None None _______ Normal Normal Normal Normal  ?R. Extensor digitorum communis Normal None None None _______ Normal Normal Normal Normal  ?R. Cervical paraspinals Normal None None None _______ Normal Normal Normal Normal  ?L. First dorsal interosseous Normal None None None _______ Normal Normal Normal Normal  ?L. Pronator teres Normal None None None _______ Normal Normal Normal Normal  ?L. Biceps brachii Normal None None None _______ Normal Normal Normal Normal  ?L. Deltoid Normal None None None _______ Normal Normal Normal Normal   ?L. Extensor digitorum communis Normal None None None _______ Normal Normal Normal Normal  ?L. Cervical paraspinals Normal None None None _______ Normal Normal Normal Normal  ?L. Abductor pollicis brevis Normal None None None _______ Normal Normal Normal Reduced  ? ?  ?

## 2021-06-08 NOTE — Progress Notes (Unsigned)
+  [facility]  PATIENT: Jason Meza DOB: 05/30/1990  REFERRING DOCTOR OR PCP:  *** SOURCE: ***  _________________________________   HISTORICAL  CHIEF COMPLAINT:  No chief complaint on file.   HISTORY OF PRESENT ILLNESS:  ***  REVIEW OF SYSTEMS: Constitutional: No fevers, chills, sweats, or change in appetite Eyes: No visual changes, double vision, eye pain Ear, nose and throat: No hearing loss, ear pain, nasal congestion, sore throat Cardiovascular: No chest pain, palpitations Respiratory:  No shortness of breath at rest or with exertion.   No wheezes GastrointestinaI: No nausea, vomiting, diarrhea, abdominal pain, fecal incontinence Genitourinary:  No dysuria, urinary retention or frequency.  No nocturia. Musculoskeletal:  No neck pain, back pain Integumentary: No rash, pruritus, skin lesions Neurological: as above Psychiatric: No depression at this time.  No anxiety Endocrine: No palpitations, diaphoresis, change in appetite, change in weigh or increased thirst Hematologic/Lymphatic:  No anemia, purpura, petechiae. Allergic/Immunologic: No itchy/runny eyes, nasal congestion, recent allergic reactions, rashes  ALLERGIES: Allergies  Allergen Reactions   Chloral Hydrate     HOME MEDICATIONS:  Current Outpatient Medications:    amphetamine-dextroamphetamine (ADDERALL XR) 10 MG 24 hr capsule, Take 10 mg by mouth daily., Disp: , Rfl:    buPROPion (WELLBUTRIN XL) 150 MG 24 hr tablet, Take 150 mg by mouth daily., Disp: , Rfl:    DULoxetine (CYMBALTA) 60 MG capsule, Take 60 mg by mouth 2 (two) times daily., Disp: , Rfl:    famotidine (PEPCID) 40 MG tablet, Take 40 mg by mouth 2 (two) times daily., Disp: , Rfl:    meclizine (ANTIVERT) 12.5 MG tablet, Take 12.5 mg by mouth 3 (three) times daily as needed for dizziness., Disp: , Rfl:    OXYBUTYNIN CHLORIDE PO, Take 5 mg by mouth., Disp: , Rfl:    pantoprazole (PROTONIX) 40 MG tablet, Take 40 mg by mouth  daily., Disp: , Rfl:    SUMAtriptan (IMITREX) 100 MG tablet, Take 100 mg by mouth every 2 (two) hours as needed for migraine. May repeat in 2 hours if headache persists or recurs., Disp: , Rfl:    traZODone (DESYREL) 50 MG tablet, Take 50 mg by mouth at bedtime., Disp: , Rfl:   PAST MEDICAL HISTORY: Past Medical History:  Diagnosis Date   ADHD    Anxiety    Tic disorder     PAST SURGICAL HISTORY: Past Surgical History:  Procedure Laterality Date   BIOPSY  03/30/2016   Procedure: BIOPSY;  Surgeon: West BaliSandi L Fields, MD;  Location: [facility];  Service: Endoscopy;;  ileum, right and leeft colon; rectum   COLONOSCOPY WITH PROPOFOL N/A 03/30/2016   Procedure: COLONOSCOPY WITH PROPOFOL;  Surgeon: West BaliSandi L Fields, MD;  [facility]: [facility];  Service: Endoscopy;  Laterality: N/A;  12:15pm   TONSILECTOMY, ADENOIDECTOMY, BILATERAL MYRINGOTOMY AND TUBES     TYMPANOSTOMY TUBE PLACEMENT      FAMILY HISTORY: Family History  Problem Relation Age of Onset   Depression Mother    Arthritis Father    Colon cancer Paternal Grandfather    Crohn's disease Neg Hx    Ulcerative colitis Neg Hx    Neuropathy Neg Hx     SOCIAL HISTORY:  Social History   Socioeconomic History   Marital status: Single    Spouse name: Not on file   Number of children: Not on file   Years of education: Not on file   Highest education level: Not on file  Occupational History   Not on  file  Tobacco Use   Smoking status: Never   Smokeless tobacco: Former    Types: Snuff  Vaping Use   Vaping Use: Never used  Substance and Sexual Activity   Alcohol use: Yes    Alcohol/week: 6.0 standard drinks    Types: 6 Cans of beer per week   Drug use: No   Sexual activity: Not on file  Other Topics Concern   Not on file  Social History Narrative   Not on file   Social Determinants of Health   Financial Resource Strain: Not on file  Food Insecurity: Not on file  Transportation Needs: Not on file  Physical Activity:  Not on file  Stress: Not on file  Social Connections: Not on file  Intimate Partner Violence: Not on file     PHYSICAL EXAM  There were no vitals filed for this visit.  There is no height or weight on file to calculate BMI.   General: The patient is well-developed and well-nourished and in no acute distress  HEENT:  Head is [place]/AT.  Sclera are anicteric.  Funduscopic exam shows normal optic discs and retinal vessels.  Neck: No carotid bruits are noted.  The neck is nontender.  Cardiovascular: The heart has a regular rate and rhythm with a normal S1 and S2. There were no murmurs, gallops or rubs.    Skin: Extremities are without rash or  edema.  Musculoskeletal:  Back is nontender  Neurologic Exam  Mental status: The patient is alert and oriented x 3 at the time of the examination. The patient has apparent normal recent and remote memory, with an apparently normal attention span and concentration ability.   Speech is normal.  Cranial nerves: Extraocular movements are full. Pupils are equal, round, and reactive to light and accomodation.  Visual fields are full.  Facial symmetry is present. There is good facial sensation to soft touch bilaterally.Facial strength is normal.  Trapezius and sternocleidomastoid strength is normal. No dysarthria is noted.  The tongue is midline, and the patient has symmetric elevation of the soft palate. No obvious hearing deficits are noted.  Motor:  Muscle bulk is normal.   Tone is normal. Strength is  5 / 5 in all 4 extremities.   Sensory: Sensory testing is intact to pinprick, soft touch and vibration sensation in all 4 extremities.  Coordination: Cerebellar testing reveals good finger-nose-finger and heel-to-shin bilaterally.  Gait and station: Station is normal.   Gait is normal. Tandem gait is normal. Romberg is negative.   Reflexes: Deep tendon reflexes are symmetric and normal bilaterally.   Plantar responses are flexor.    DIAGNOSTIC  DATA (LABS, IMAGING, TESTING) - I reviewed patient records, labs, notes, testing and imaging myself where available.  Lab Results  Component Value Date   WBC 5.6 04/17/2021   HGB 16.4 04/17/2021   HCT 46.4 04/17/2021   MCV 96 04/17/2021   PLT 307 04/17/2021      Component Value Date/Time   NA 140 04/17/2021 0910   K 4.5 04/17/2021 0910   CL 98 04/17/2021 0910   CO2 27 04/17/2021 0910   GLUCOSE 79 04/17/2021 0910   GLUCOSE 85 03/15/2016 1035   BUN 8 04/17/2021 0910   CREATININE 1.29 (H) 04/17/2021 0910   CREATININE 1.23 03/15/2016 1035   CALCIUM 10.1 04/17/2021 0910   PROT 6.7 04/17/2021 0910   ALBUMIN 4.7 04/17/2021 0910   AST 28 04/17/2021 0910   ALT 25 04/17/2021 0910   ALKPHOS 100  04/17/2021 0910   BILITOT 0.4 04/17/2021 0910   No results found for: CHOL, HDL, LDLCALC, LDLDIRECT, TRIG, CHOLHDL No results found for: ONGE9B No results found for: VITAMINB12 Lab Results  Component Value Date   TSH 2.120 04/17/2021       ASSESSMENT AND PLAN  ***   Oluwatamilore Starnes A. Epimenio Foot, MD, [facility] 06/08/2021, 3:05 PM Certified in Neurology, Clinical Neurophysiology, Sleep Medicine and Neuroimaging  [facility] [address] [place], [place] [address] [phone]

## 2021-07-01 ENCOUNTER — Encounter: Payer: BC Managed Care – PPO | Admitting: Neurology

## 2021-07-11 DIAGNOSIS — R69 Illness, unspecified: Secondary | ICD-10-CM | POA: Diagnosis not present

## 2021-07-11 DIAGNOSIS — Z6829 Body mass index (BMI) 29.0-29.9, adult: Secondary | ICD-10-CM | POA: Diagnosis not present

## 2021-07-11 DIAGNOSIS — K2100 Gastro-esophageal reflux disease with esophagitis, without bleeding: Secondary | ICD-10-CM | POA: Diagnosis not present

## 2021-07-29 DIAGNOSIS — K2100 Gastro-esophageal reflux disease with esophagitis, without bleeding: Secondary | ICD-10-CM | POA: Diagnosis not present

## 2021-07-29 DIAGNOSIS — R69 Illness, unspecified: Secondary | ICD-10-CM | POA: Diagnosis not present

## 2021-07-29 DIAGNOSIS — Z6829 Body mass index (BMI) 29.0-29.9, adult: Secondary | ICD-10-CM | POA: Diagnosis not present

## 2021-07-29 DIAGNOSIS — E559 Vitamin D deficiency, unspecified: Secondary | ICD-10-CM | POA: Diagnosis not present

## 2021-08-04 ENCOUNTER — Other Ambulatory Visit (HOSPITAL_COMMUNITY): Payer: Self-pay | Admitting: Family Medicine

## 2021-08-04 ENCOUNTER — Other Ambulatory Visit: Payer: Self-pay | Admitting: Family Medicine

## 2021-08-04 DIAGNOSIS — R748 Abnormal levels of other serum enzymes: Secondary | ICD-10-CM

## 2021-08-17 ENCOUNTER — Ambulatory Visit (HOSPITAL_COMMUNITY)
Admission: RE | Admit: 2021-08-17 | Discharge: 2021-08-17 | Disposition: A | Discharge status: Home / Self Care | Payer: 59 | Source: Ambulatory Visit | Attending: Family Medicine | Admitting: Family Medicine

## 2021-08-17 DIAGNOSIS — R7989 Other specified abnormal findings of blood chemistry: Secondary | ICD-10-CM | POA: Diagnosis not present

## 2021-08-17 DIAGNOSIS — R748 Abnormal levels of other serum enzymes: Secondary | ICD-10-CM | POA: Insufficient documentation

## 2021-08-17 DIAGNOSIS — K760 Fatty (change of) liver, not elsewhere classified: Secondary | ICD-10-CM | POA: Diagnosis not present

## 2021-08-19 DIAGNOSIS — F900 Attention-deficit hyperactivity disorder, predominantly inattentive type: Secondary | ICD-10-CM | POA: Diagnosis not present

## 2021-08-19 DIAGNOSIS — K581 Irritable bowel syndrome with constipation: Secondary | ICD-10-CM | POA: Diagnosis not present

## 2021-08-19 DIAGNOSIS — F411 Generalized anxiety disorder: Secondary | ICD-10-CM | POA: Diagnosis not present

## 2021-08-19 DIAGNOSIS — K2100 Gastro-esophageal reflux disease with esophagitis, without bleeding: Secondary | ICD-10-CM | POA: Diagnosis not present

## 2021-08-19 DIAGNOSIS — I10 Essential (primary) hypertension: Secondary | ICD-10-CM | POA: Diagnosis not present

## 2021-08-19 DIAGNOSIS — R69 Illness, unspecified: Secondary | ICD-10-CM | POA: Diagnosis not present

## 2021-08-19 DIAGNOSIS — E559 Vitamin D deficiency, unspecified: Secondary | ICD-10-CM | POA: Diagnosis not present

## 2021-08-19 DIAGNOSIS — Z6829 Body mass index (BMI) 29.0-29.9, adult: Secondary | ICD-10-CM | POA: Diagnosis not present

## 2021-08-25 DIAGNOSIS — F411 Generalized anxiety disorder: Secondary | ICD-10-CM | POA: Diagnosis not present

## 2021-08-25 DIAGNOSIS — R69 Illness, unspecified: Secondary | ICD-10-CM | POA: Diagnosis not present

## 2021-09-10 DIAGNOSIS — F900 Attention-deficit hyperactivity disorder, predominantly inattentive type: Secondary | ICD-10-CM | POA: Diagnosis not present

## 2021-09-10 DIAGNOSIS — F411 Generalized anxiety disorder: Secondary | ICD-10-CM | POA: Diagnosis not present

## 2021-09-10 DIAGNOSIS — E559 Vitamin D deficiency, unspecified: Secondary | ICD-10-CM | POA: Diagnosis not present

## 2021-09-10 DIAGNOSIS — Z6829 Body mass index (BMI) 29.0-29.9, adult: Secondary | ICD-10-CM | POA: Diagnosis not present

## 2021-09-10 DIAGNOSIS — K2100 Gastro-esophageal reflux disease with esophagitis, without bleeding: Secondary | ICD-10-CM | POA: Diagnosis not present

## 2021-09-10 DIAGNOSIS — K581 Irritable bowel syndrome with constipation: Secondary | ICD-10-CM | POA: Diagnosis not present

## 2021-09-10 DIAGNOSIS — R69 Illness, unspecified: Secondary | ICD-10-CM | POA: Diagnosis not present

## 2021-12-16 DIAGNOSIS — R7989 Other specified abnormal findings of blood chemistry: Secondary | ICD-10-CM | POA: Diagnosis not present

## 2021-12-16 DIAGNOSIS — Z79899 Other long term (current) drug therapy: Secondary | ICD-10-CM | POA: Diagnosis not present

## 2021-12-16 DIAGNOSIS — K219 Gastro-esophageal reflux disease without esophagitis: Secondary | ICD-10-CM | POA: Diagnosis not present

## 2021-12-16 DIAGNOSIS — E559 Vitamin D deficiency, unspecified: Secondary | ICD-10-CM | POA: Diagnosis not present

## 2021-12-16 DIAGNOSIS — Z13220 Encounter for screening for lipoid disorders: Secondary | ICD-10-CM | POA: Diagnosis not present

## 2021-12-21 DIAGNOSIS — R69 Illness, unspecified: Secondary | ICD-10-CM | POA: Diagnosis not present

## 2021-12-21 DIAGNOSIS — F411 Generalized anxiety disorder: Secondary | ICD-10-CM | POA: Diagnosis not present

## 2021-12-21 DIAGNOSIS — K581 Irritable bowel syndrome with constipation: Secondary | ICD-10-CM | POA: Diagnosis not present

## 2021-12-21 DIAGNOSIS — E559 Vitamin D deficiency, unspecified: Secondary | ICD-10-CM | POA: Diagnosis not present

## 2021-12-21 DIAGNOSIS — Z6828 Body mass index (BMI) 28.0-28.9, adult: Secondary | ICD-10-CM | POA: Diagnosis not present

## 2021-12-21 DIAGNOSIS — F900 Attention-deficit hyperactivity disorder, predominantly inattentive type: Secondary | ICD-10-CM | POA: Diagnosis not present

## 2021-12-21 DIAGNOSIS — K2100 Gastro-esophageal reflux disease with esophagitis, without bleeding: Secondary | ICD-10-CM | POA: Diagnosis not present

## 2022-02-23 DIAGNOSIS — Z20828 Contact with and (suspected) exposure to other viral communicable diseases: Secondary | ICD-10-CM | POA: Diagnosis not present

## 2022-02-23 DIAGNOSIS — Z6829 Body mass index (BMI) 29.0-29.9, adult: Secondary | ICD-10-CM | POA: Diagnosis not present

## 2022-02-23 DIAGNOSIS — R030 Elevated blood-pressure reading, without diagnosis of hypertension: Secondary | ICD-10-CM | POA: Diagnosis not present

## 2022-02-23 DIAGNOSIS — J029 Acute pharyngitis, unspecified: Secondary | ICD-10-CM | POA: Diagnosis not present

## 2022-02-23 DIAGNOSIS — J069 Acute upper respiratory infection, unspecified: Secondary | ICD-10-CM | POA: Diagnosis not present

## 2022-05-03 DIAGNOSIS — K581 Irritable bowel syndrome with constipation: Secondary | ICD-10-CM | POA: Diagnosis not present

## 2022-05-03 DIAGNOSIS — F900 Attention-deficit hyperactivity disorder, predominantly inattentive type: Secondary | ICD-10-CM | POA: Diagnosis not present

## 2022-05-03 DIAGNOSIS — K2100 Gastro-esophageal reflux disease with esophagitis, without bleeding: Secondary | ICD-10-CM | POA: Diagnosis not present

## 2022-05-03 DIAGNOSIS — R69 Illness, unspecified: Secondary | ICD-10-CM | POA: Diagnosis not present

## 2022-05-03 DIAGNOSIS — E559 Vitamin D deficiency, unspecified: Secondary | ICD-10-CM | POA: Diagnosis not present

## 2022-05-03 DIAGNOSIS — F411 Generalized anxiety disorder: Secondary | ICD-10-CM | POA: Diagnosis not present

## 2022-09-13 DIAGNOSIS — K581 Irritable bowel syndrome with constipation: Secondary | ICD-10-CM | POA: Diagnosis not present

## 2022-09-13 DIAGNOSIS — F411 Generalized anxiety disorder: Secondary | ICD-10-CM | POA: Diagnosis not present

## 2022-09-13 DIAGNOSIS — K2100 Gastro-esophageal reflux disease with esophagitis, without bleeding: Secondary | ICD-10-CM | POA: Diagnosis not present

## 2022-09-13 DIAGNOSIS — F900 Attention-deficit hyperactivity disorder, predominantly inattentive type: Secondary | ICD-10-CM | POA: Diagnosis not present

## 2022-09-13 DIAGNOSIS — R4582 Worries: Secondary | ICD-10-CM | POA: Diagnosis not present

## 2022-09-13 DIAGNOSIS — E559 Vitamin D deficiency, unspecified: Secondary | ICD-10-CM | POA: Diagnosis not present

## 2022-09-13 DIAGNOSIS — F331 Major depressive disorder, recurrent, moderate: Secondary | ICD-10-CM | POA: Diagnosis not present

## 2022-12-29 DIAGNOSIS — E559 Vitamin D deficiency, unspecified: Secondary | ICD-10-CM | POA: Diagnosis not present

## 2022-12-29 DIAGNOSIS — K581 Irritable bowel syndrome with constipation: Secondary | ICD-10-CM | POA: Diagnosis not present

## 2022-12-29 DIAGNOSIS — F411 Generalized anxiety disorder: Secondary | ICD-10-CM | POA: Diagnosis not present

## 2022-12-29 DIAGNOSIS — K2100 Gastro-esophageal reflux disease with esophagitis, without bleeding: Secondary | ICD-10-CM | POA: Diagnosis not present

## 2022-12-29 DIAGNOSIS — F900 Attention-deficit hyperactivity disorder, predominantly inattentive type: Secondary | ICD-10-CM | POA: Diagnosis not present

## 2022-12-29 DIAGNOSIS — F331 Major depressive disorder, recurrent, moderate: Secondary | ICD-10-CM | POA: Diagnosis not present

## 2023-06-08 DIAGNOSIS — Z6825 Body mass index (BMI) 25.0-25.9, adult: Secondary | ICD-10-CM | POA: Diagnosis not present

## 2023-06-08 DIAGNOSIS — R300 Dysuria: Secondary | ICD-10-CM | POA: Diagnosis not present

## 2023-08-18 DIAGNOSIS — F331 Major depressive disorder, recurrent, moderate: Secondary | ICD-10-CM | POA: Diagnosis not present

## 2023-08-18 DIAGNOSIS — K5901 Slow transit constipation: Secondary | ICD-10-CM | POA: Diagnosis not present

## 2023-08-18 DIAGNOSIS — K2100 Gastro-esophageal reflux disease with esophagitis, without bleeding: Secondary | ICD-10-CM | POA: Diagnosis not present

## 2023-08-18 DIAGNOSIS — F900 Attention-deficit hyperactivity disorder, predominantly inattentive type: Secondary | ICD-10-CM | POA: Diagnosis not present

## 2023-08-18 DIAGNOSIS — Z6825 Body mass index (BMI) 25.0-25.9, adult: Secondary | ICD-10-CM | POA: Diagnosis not present

## 2024-02-13 DIAGNOSIS — F900 Attention-deficit hyperactivity disorder, predominantly inattentive type: Secondary | ICD-10-CM | POA: Diagnosis not present

## 2024-02-13 DIAGNOSIS — K2100 Gastro-esophageal reflux disease with esophagitis, without bleeding: Secondary | ICD-10-CM | POA: Diagnosis not present

## 2024-02-13 DIAGNOSIS — F331 Major depressive disorder, recurrent, moderate: Secondary | ICD-10-CM | POA: Diagnosis not present

## 2024-02-22 IMAGING — US US ABDOMEN COMPLETE
1 series · 14 of 25 positions shown · non-contrast
Comparison: None Available.

CLINICAL DATA: 30-year-old male with elevated LFTs for 6 months.

EXAM:
ABDOMEN ULTRASOUND COMPLETE

[Series 1: us abdomen complete · 14 of 98 slices shown]
[im 1/98]
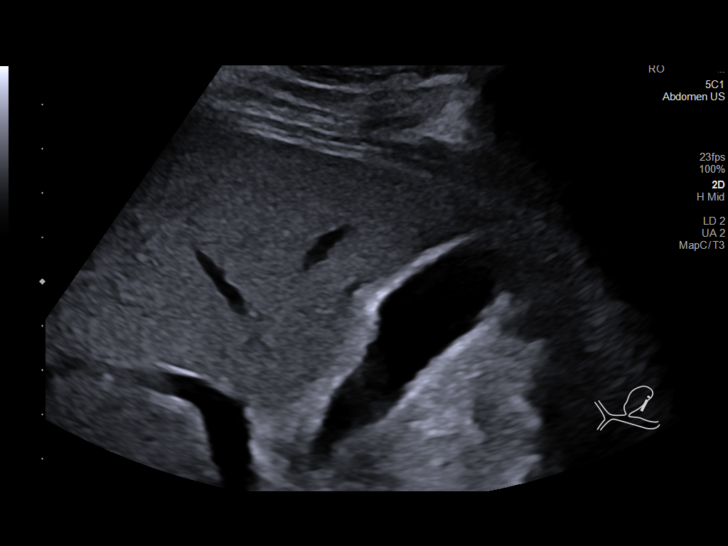
[im 9/98]
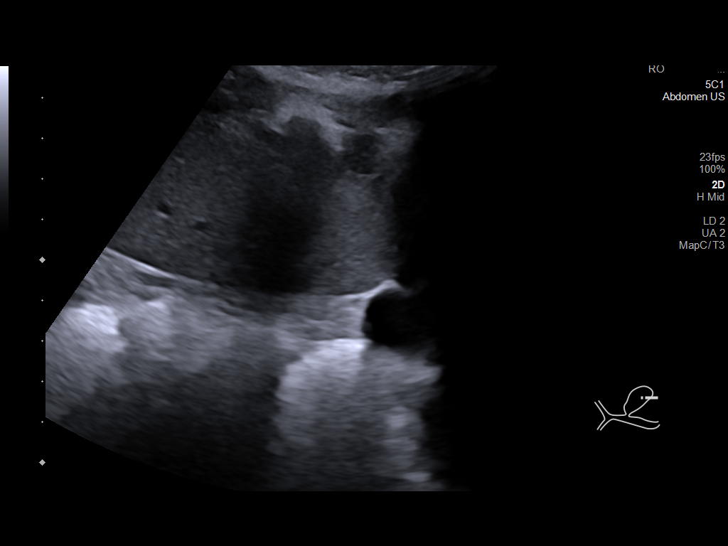
[im 17/98]
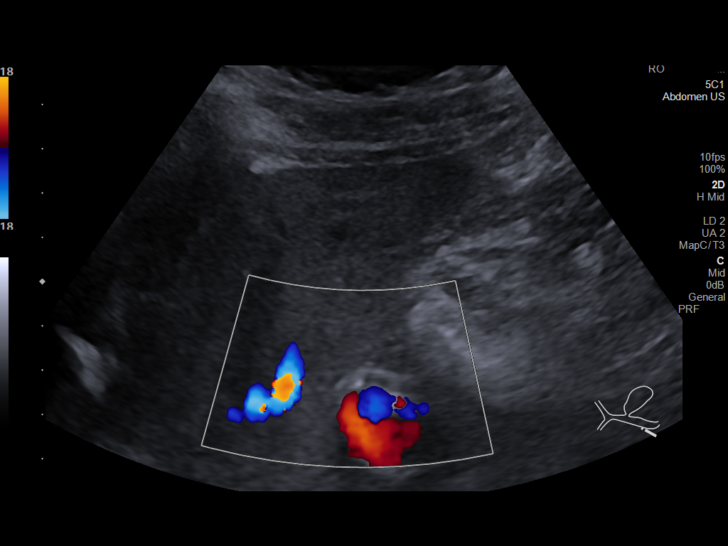
[im 25/98]
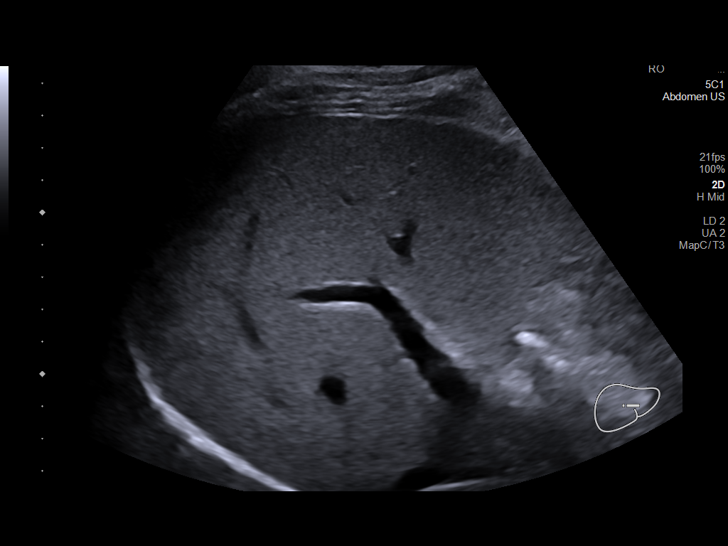
[im 33/98]
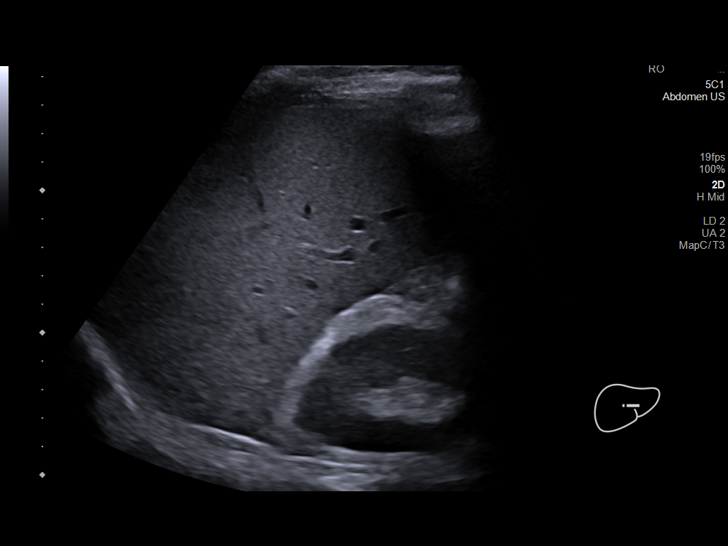
[im 37/98]
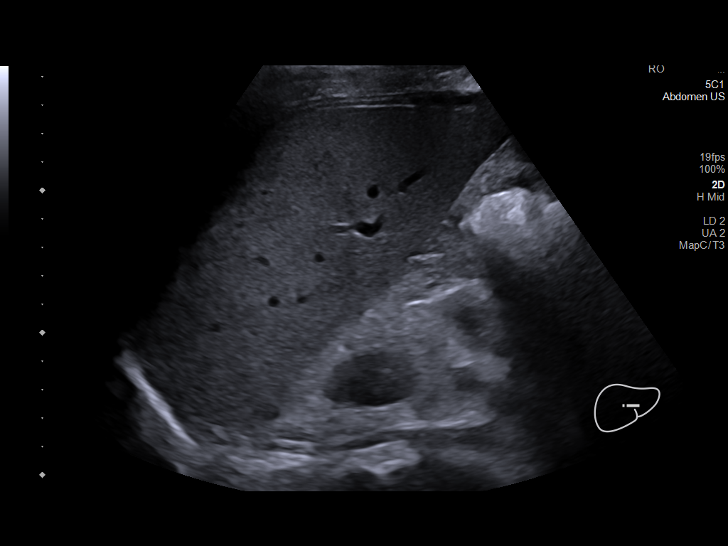
[im 45/98]
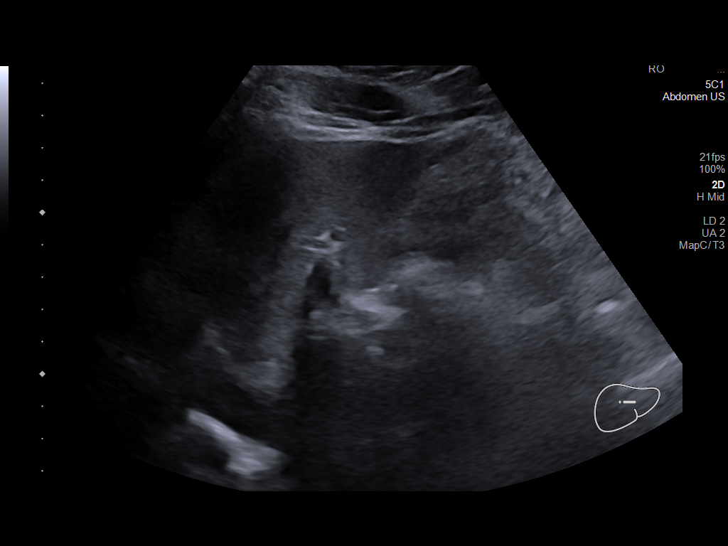
[im 53/98]
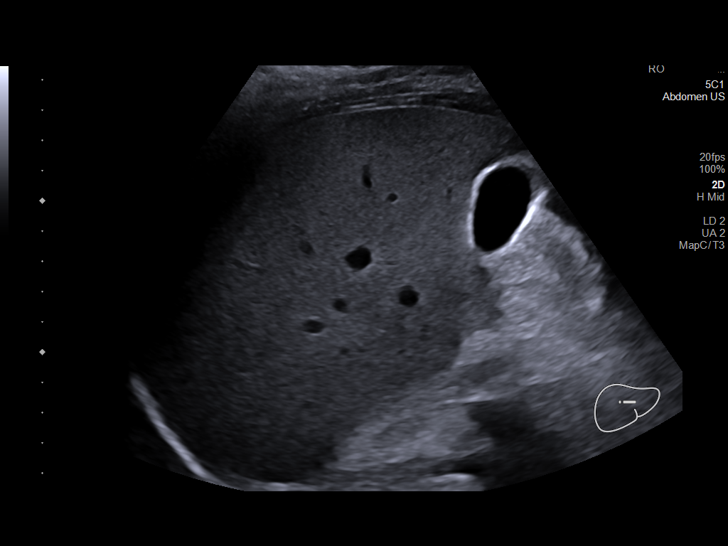
[im 61/98]
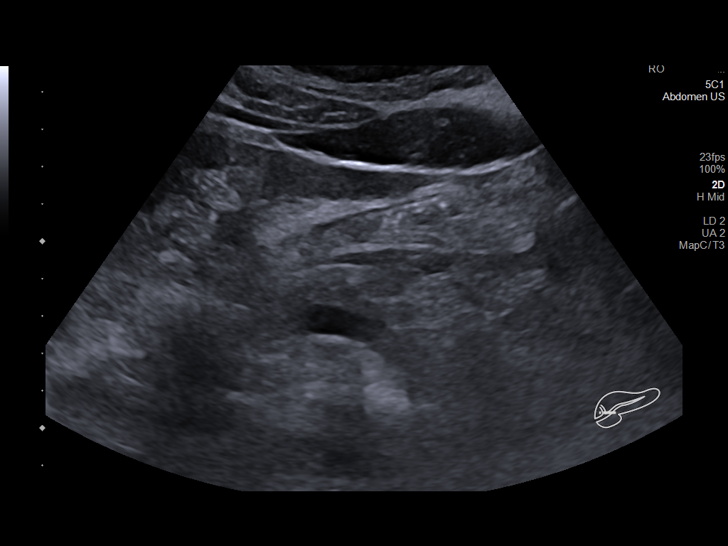
[im 65/98]
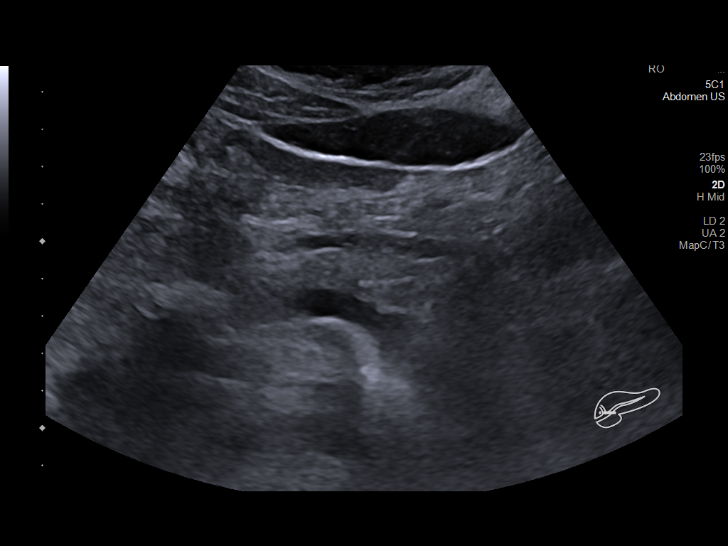
[im 73/98]
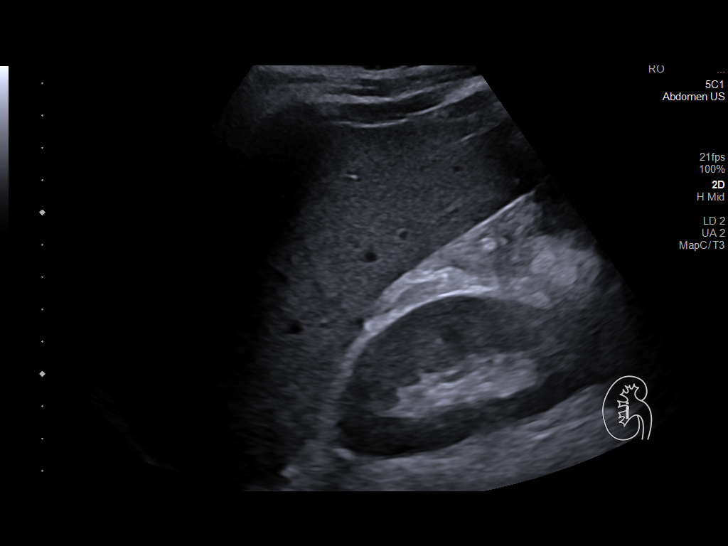
[im 81/98]
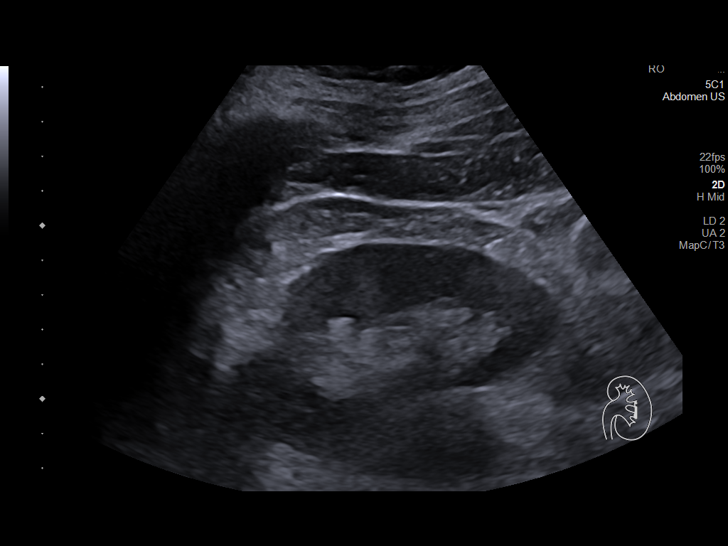
[im 89/98]
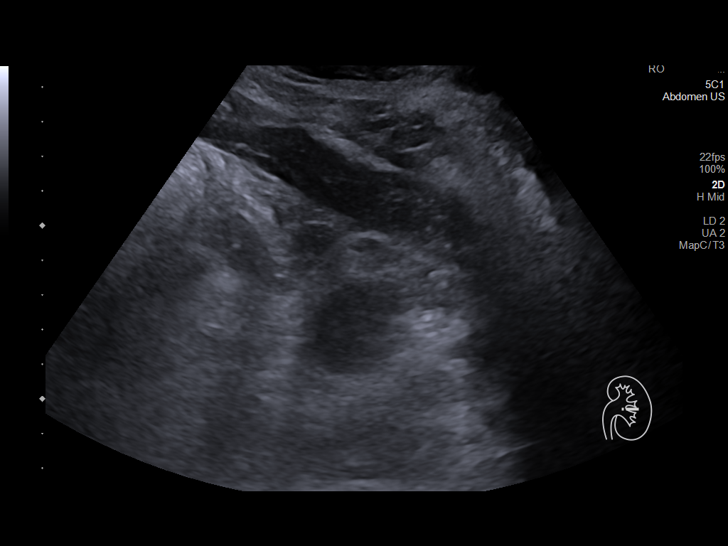
[im 98/98]
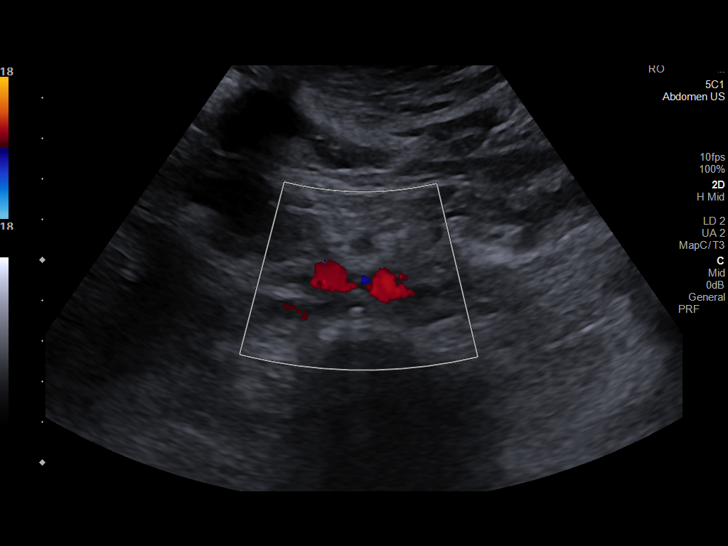

[14 of 25 positions shown; findings below may reference images not displayed]

FINDINGS: Gallbladder: The gallbladder is unremarkable. There is no evidence
of cholelithiasis or acute cholecystitis.

Common bile duct: Diameter: 3 mm. There is no evidence of
intrahepatic or extrahepatic biliary dilatation.

Liver: Slightly increased hepatic echogenicity noted. No other
hepatic abnormalities are identified. Portal vein is patent on color
Doppler imaging with normal direction of blood flow towards the
liver.

IVC: No abnormality visualized.

Pancreas: Visualized portion unremarkable.

Spleen: Size and appearance within normal limits.

Right Kidney: Length: 8.7 cm. Echogenicity within normal limits. No
mass or hydronephrosis visualized.

Left Kidney: Length: 9.7 cm. Echogenicity within normal limits. No
mass or hydronephrosis visualized.

Abdominal aorta: No aneurysm visualized.

Other findings: None.
IMPRESSION: 1. Mild hepatic steatosis.
2. No other significant abnormalities.
